# Patient Record
Sex: Male | Born: 1950
Health system: Southern US, Community
[De-identification: ages and names within clinical notes are randomized; demographics above are authoritative.]

## PROBLEM LIST (undated history)

## (undated) DIAGNOSIS — E119 Type 2 diabetes mellitus without complications: Secondary | ICD-10-CM

---

## 1999-10-14 ENCOUNTER — Emergency Department (HOSPITAL_COMMUNITY): Admission: EM | Admit: 1999-10-14 | Discharge: 1999-10-14 | Payer: Self-pay | Admitting: Emergency Medicine

## 2002-07-27 ENCOUNTER — Emergency Department (HOSPITAL_COMMUNITY): Admission: EM | Admit: 2002-07-27 | Discharge: 2002-07-27 | Payer: Self-pay | Admitting: Emergency Medicine

## 2003-05-31 ENCOUNTER — Ambulatory Visit (HOSPITAL_COMMUNITY): Admission: RE | Admit: 2003-05-31 | Discharge: 2003-05-31 | Payer: Self-pay | Admitting: Internal Medicine

## 2003-07-12 ENCOUNTER — Emergency Department (HOSPITAL_COMMUNITY): Admission: EM | Admit: 2003-07-12 | Discharge: 2003-07-13 | Payer: Self-pay | Admitting: Emergency Medicine

## 2003-07-13 ENCOUNTER — Emergency Department (HOSPITAL_COMMUNITY): Admission: EM | Admit: 2003-07-13 | Discharge: 2003-07-13 | Payer: Self-pay | Admitting: Emergency Medicine

## 2004-01-01 ENCOUNTER — Ambulatory Visit: Payer: Self-pay | Admitting: Internal Medicine

## 2004-03-19 ENCOUNTER — Ambulatory Visit: Payer: Self-pay | Admitting: Internal Medicine

## 2004-04-18 ENCOUNTER — Ambulatory Visit: Payer: Self-pay | Admitting: Internal Medicine

## 2004-04-22 ENCOUNTER — Ambulatory Visit: Payer: Self-pay | Admitting: Internal Medicine

## 2004-06-02 ENCOUNTER — Ambulatory Visit: Payer: Self-pay | Admitting: Internal Medicine

## 2004-06-04 ENCOUNTER — Ambulatory Visit: Payer: Self-pay | Admitting: Internal Medicine

## 2004-06-17 ENCOUNTER — Ambulatory Visit: Payer: Self-pay | Admitting: Internal Medicine

## 2004-07-15 ENCOUNTER — Ambulatory Visit: Payer: Self-pay | Admitting: Internal Medicine

## 2004-07-16 ENCOUNTER — Ambulatory Visit: Payer: Self-pay | Admitting: *Deleted

## 2005-09-30 ENCOUNTER — Ambulatory Visit: Payer: Self-pay | Admitting: Family Medicine

## 2005-10-01 ENCOUNTER — Encounter (INDEPENDENT_AMBULATORY_CARE_PROVIDER_SITE_OTHER): Payer: Self-pay | Admitting: Internal Medicine

## 2005-11-23 ENCOUNTER — Ambulatory Visit: Payer: Self-pay | Admitting: Internal Medicine

## 2005-11-26 ENCOUNTER — Ambulatory Visit: Payer: Self-pay | Admitting: Internal Medicine

## 2006-02-11 ENCOUNTER — Ambulatory Visit: Payer: Self-pay | Admitting: Internal Medicine

## 2006-03-23 ENCOUNTER — Ambulatory Visit: Payer: Self-pay | Admitting: Internal Medicine

## 2006-05-07 ENCOUNTER — Ambulatory Visit: Payer: Self-pay | Admitting: Internal Medicine

## 2006-10-20 ENCOUNTER — Emergency Department (HOSPITAL_COMMUNITY): Admission: EM | Admit: 2006-10-20 | Discharge: 2006-10-20 | Payer: Self-pay

## 2006-11-15 ENCOUNTER — Ambulatory Visit: Payer: Self-pay | Admitting: Family Medicine

## 2006-11-23 ENCOUNTER — Ambulatory Visit: Payer: Self-pay | Admitting: Internal Medicine

## 2006-11-23 DIAGNOSIS — E785 Hyperlipidemia, unspecified: Secondary | ICD-10-CM

## 2006-11-25 ENCOUNTER — Encounter: Admission: RE | Admit: 2006-11-25 | Discharge: 2006-12-08 | Payer: Self-pay | Admitting: Family Medicine

## 2006-12-29 ENCOUNTER — Ambulatory Visit: Payer: Self-pay | Admitting: Internal Medicine

## 2006-12-29 ENCOUNTER — Encounter (INDEPENDENT_AMBULATORY_CARE_PROVIDER_SITE_OTHER): Payer: Self-pay | Admitting: *Deleted

## 2006-12-29 LAB — CONVERTED CEMR LAB
Alkaline Phosphatase: 77 units/L (ref 39–117)
BUN: 11 mg/dL (ref 6–23)
Creatinine, Ser: 0.93 mg/dL (ref 0.40–1.50)
Glucose, Bld: 76 mg/dL (ref 70–99)
HDL: 38 mg/dL — ABNORMAL LOW (ref >39)
LDL Cholesterol: 103 mg/dL — ABNORMAL HIGH (ref 0–99)
Total Bilirubin: 0.4 mg/dL (ref 0.3–1.2)
Triglycerides: 155 mg/dL — ABNORMAL HIGH (ref <150)
VLDL: 31 mg/dL (ref 0–40)

## 2007-01-13 ENCOUNTER — Ambulatory Visit: Payer: Self-pay | Admitting: Internal Medicine

## 2007-04-21 ENCOUNTER — Ambulatory Visit: Payer: Self-pay | Admitting: Internal Medicine

## 2007-04-21 LAB — CONVERTED CEMR LAB
ALT: 26 units/L (ref 0–53)
AST: 26 units/L (ref 0–37)
Cholesterol: 183 mg/dL (ref 0–200)
Creatinine, Ser: 0.94 mg/dL (ref 0.40–1.50)
HDL: 45 mg/dL (ref >39)
PSA: 1.05 ng/mL (ref 0.10–4.00)
Total Bilirubin: 0.7 mg/dL (ref 0.3–1.2)
Total CHOL/HDL Ratio: 4.1
VLDL: 21 mg/dL (ref 0–40)

## 2007-06-15 ENCOUNTER — Ambulatory Visit: Payer: Self-pay | Admitting: Internal Medicine

## 2008-03-22 ENCOUNTER — Ambulatory Visit: Payer: Self-pay | Admitting: Internal Medicine

## 2008-05-22 ENCOUNTER — Ambulatory Visit: Payer: Self-pay | Admitting: Internal Medicine

## 2008-05-22 LAB — CONVERTED CEMR LAB
Calcium: 10.1 mg/dL (ref 8.4–10.5)
Cholesterol: 168 mg/dL (ref 0–200)
Creatinine, Ser: 0.79 mg/dL (ref 0.40–1.50)
Glucose, Bld: 108 mg/dL — ABNORMAL HIGH (ref 70–99)
Sodium: 143 meq/L (ref 135–145)
Total CHOL/HDL Ratio: 3.6

## 2008-06-05 ENCOUNTER — Ambulatory Visit: Payer: Self-pay | Admitting: Internal Medicine

## 2008-06-26 ENCOUNTER — Ambulatory Visit: Payer: Self-pay | Admitting: *Deleted

## 2008-09-04 ENCOUNTER — Ambulatory Visit: Payer: Self-pay | Admitting: Internal Medicine

## 2009-05-30 ENCOUNTER — Ambulatory Visit: Payer: Self-pay | Admitting: Family Medicine

## 2010-02-18 ENCOUNTER — Encounter (INDEPENDENT_AMBULATORY_CARE_PROVIDER_SITE_OTHER): Payer: Self-pay | Admitting: Internal Medicine

## 2010-02-18 LAB — CONVERTED CEMR LAB
AST: 25 units/L (ref 0–37)
Alkaline Phosphatase: 55 units/L (ref 39–117)
BUN: 10 mg/dL (ref 6–23)
Basophils Relative: 1 % (ref 0–1)
Creatinine, Ser: 0.89 mg/dL (ref 0.40–1.50)
Eosinophils Absolute: 0.1 10*3/uL (ref 0.0–0.7)
Eosinophils Relative: 2 % (ref 0–5)
Glucose, Bld: 98 mg/dL (ref 70–99)
HCT: 44.4 % (ref 39.0–52.0)
HDL: 43 mg/dL (ref >39)
Hemoglobin: 14.6 g/dL (ref 13.0–17.0)
Hgb A1c MFr Bld: 6.4 % — ABNORMAL HIGH (ref <5.7)
LDL Cholesterol: 106 mg/dL — ABNORMAL HIGH (ref 0–99)
Lymphs Abs: 2.7 10*3/uL (ref 0.7–4.0)
MCHC: 32.9 g/dL (ref 30.0–36.0)
MCV: 93.7 fL (ref 78.0–100.0)
Monocytes Absolute: 0.5 10*3/uL (ref 0.1–1.0)
Monocytes Relative: 8 % (ref 3–12)
Neutrophils Relative %: 48 % (ref 43–77)
RBC: 4.74 M/uL (ref 4.22–5.81)
Total Bilirubin: 0.7 mg/dL (ref 0.3–1.2)
Total CHOL/HDL Ratio: 4
Triglycerides: 109 mg/dL (ref <150)

## 2010-08-01 ENCOUNTER — Emergency Department (HOSPITAL_COMMUNITY)
Admission: EM | Admit: 2010-08-01 | Discharge: 2010-08-01 | Disposition: A | Discharge status: Home / Self Care | Payer: No Typology Code available for payment source | Attending: Emergency Medicine | Admitting: Emergency Medicine

## 2010-08-01 DIAGNOSIS — R259 Unspecified abnormal involuntary movements: Secondary | ICD-10-CM | POA: Insufficient documentation

## 2010-08-01 DIAGNOSIS — Z79899 Other long term (current) drug therapy: Secondary | ICD-10-CM | POA: Insufficient documentation

## 2010-08-01 DIAGNOSIS — M25519 Pain in unspecified shoulder: Secondary | ICD-10-CM | POA: Insufficient documentation

## 2010-08-01 DIAGNOSIS — Z7982 Long term (current) use of aspirin: Secondary | ICD-10-CM | POA: Insufficient documentation

## 2010-08-01 DIAGNOSIS — E7800 Pure hypercholesterolemia, unspecified: Secondary | ICD-10-CM | POA: Insufficient documentation

## 2010-08-01 DIAGNOSIS — M62838 Other muscle spasm: Secondary | ICD-10-CM | POA: Insufficient documentation

## 2010-08-18 ENCOUNTER — Emergency Department (HOSPITAL_COMMUNITY)
Admission: EM | Admit: 2010-08-18 | Discharge: 2010-08-19 | Disposition: A | Discharge status: Home / Self Care | Payer: No Typology Code available for payment source | Attending: Emergency Medicine | Admitting: Emergency Medicine

## 2010-08-18 ENCOUNTER — Emergency Department (HOSPITAL_COMMUNITY): Payer: No Typology Code available for payment source

## 2010-08-18 DIAGNOSIS — S6990XA Unspecified injury of unspecified wrist, hand and finger(s), initial encounter: Secondary | ICD-10-CM | POA: Insufficient documentation

## 2010-08-18 DIAGNOSIS — R29898 Other symptoms and signs involving the musculoskeletal system: Secondary | ICD-10-CM | POA: Insufficient documentation

## 2010-08-18 DIAGNOSIS — E7800 Pure hypercholesterolemia, unspecified: Secondary | ICD-10-CM | POA: Insufficient documentation

## 2010-08-18 DIAGNOSIS — Z7982 Long term (current) use of aspirin: Secondary | ICD-10-CM | POA: Insufficient documentation

## 2010-08-18 DIAGNOSIS — M5412 Radiculopathy, cervical region: Secondary | ICD-10-CM | POA: Insufficient documentation

## 2010-08-18 DIAGNOSIS — Z79899 Other long term (current) drug therapy: Secondary | ICD-10-CM | POA: Insufficient documentation

## 2010-08-18 DIAGNOSIS — Z09 Encounter for follow-up examination after completed treatment for conditions other than malignant neoplasm: Secondary | ICD-10-CM | POA: Insufficient documentation

## 2010-08-18 DIAGNOSIS — S59909A Unspecified injury of unspecified elbow, initial encounter: Secondary | ICD-10-CM | POA: Insufficient documentation

## 2010-09-17 ENCOUNTER — Emergency Department (HOSPITAL_COMMUNITY)
Admission: EM | Admit: 2010-09-17 | Discharge: 2010-09-17 | Disposition: A | Discharge status: Home / Self Care | Payer: No Typology Code available for payment source | Attending: Emergency Medicine | Admitting: Emergency Medicine

## 2010-09-17 DIAGNOSIS — M549 Dorsalgia, unspecified: Secondary | ICD-10-CM | POA: Insufficient documentation

## 2010-09-17 DIAGNOSIS — T148XXA Other injury of unspecified body region, initial encounter: Secondary | ICD-10-CM | POA: Insufficient documentation

## 2010-09-17 DIAGNOSIS — M79609 Pain in unspecified limb: Secondary | ICD-10-CM | POA: Insufficient documentation

## 2011-01-27 LAB — SAMPLE TO BLOOD BANK

## 2011-01-27 LAB — CBC
Hemoglobin: 13.9
MCHC: 34.5
MCV: 89.8
RBC: 4.49
WBC: 7.9

## 2011-01-27 LAB — I-STAT 8, (EC8 V) (CONVERTED LAB)
BUN: 13
Bicarbonate: 24.8 — ABNORMAL HIGH
Chloride: 104
Glucose, Bld: 113 — ABNORMAL HIGH
HCT: 44
Hemoglobin: 15
Operator id: 270651
pCO2, Ven: 37.6 — ABNORMAL LOW

## 2011-01-27 LAB — PROTIME-INR
INR: 1
Prothrombin Time: 13.3

## 2012-08-29 ENCOUNTER — Ambulatory Visit: Payer: No Typology Code available for payment source | Attending: Internal Medicine | Admitting: Internal Medicine

## 2012-08-29 VITALS — BP 135/77 | HR 69 | Temp 96.6°F | Resp 17 | Wt 190.0 lb

## 2012-08-29 DIAGNOSIS — H538 Other visual disturbances: Secondary | ICD-10-CM

## 2012-08-29 DIAGNOSIS — E785 Hyperlipidemia, unspecified: Secondary | ICD-10-CM

## 2012-08-29 NOTE — Progress Notes (Signed)
Patient ID: Connor Murray, male   DOB: November 23, 1950, 62 y.o.   MRN: [id number] Patient Demographics  Connor Murray, is a 62 y.o. male  [id number]  [id number]  DOB - 03-Mar-1951  Chief Complaint  Patient presents with  . Blurred Vision        Subjective:   [name] today is here to establish primary care.  His only complaint at this time is b/l blurry vision-he thinks he needs new glasses and is requesting a opthalomology referral. Patient has No headache, No chest pain, No abdominal pain - No Nausea, No new weakness tingling or numbness, No Cough - SOB.   Objective:    Filed Vitals:   08/29/12 1622  BP: 135/77  Pulse: 69  Temp: 96.6 F (35.9 C)  Resp: 17  Weight: 190 lb (86.183 kg)  SpO2: 97%     ALLERGIES:  No Known Allergies  PAST MEDICAL HISTORY: Dyslipidemia   PAST SURGICAL HISTORY: None  FAMILY HISTORY: No family hx of CAD  MEDICATIONS AT HOME: Prior to Admission medications   Medication Sig Start Date End Date Taking? Authorizing Provider  rosuvastatin (CRESTOR) 20 MG tablet Take 20 mg by mouth daily.   Yes Historical Provider, MD    REVIEW OF SYSTEMS:  Constitutional:   No   Fevers, chills, fatigue.  HEENT:    No headaches, Sore throat,   Cardio-vascular: No chest pain,  Orthopnea, swelling in lower extremities, anasarca, palpitations  GI:  No abdominal pain, nausea, vomiting, diarrhea  Resp: No shortness of breath,  No coughing up of blood.No cough.No wheezing.  Skin:  no rash or lesions.  GU:  no dysuria, change in color of urine, no urgency or frequency.  No flank pain.  Musculoskeletal: No joint pain or swelling.  No decreased range of motion.  No back pain.  Psych: No change in mood or affect. No depression or anxiety.  No memory loss.   Exam  General appearance :Awake, alert, not in any distress. Speech Clear. Not toxic Looking HEENT: Atraumatic and Normocephalic, pupils equally reactive to light and  accomodation Neck: supple, no JVD. No cervical lymphadenopathy.  Chest:Good air entry bilaterally, no added sounds  CVS: S1 S2 regular, no murmurs.  Abdomen: Bowel sounds present, Non tender and not distended with no gaurding, rigidity or rebound. Extremities: B/L Lower Ext shows no edema, both legs are warm to touch Neurology: Awake alert, and oriented X 3, CN II-XII intact, Non focal Skin:No Rash Wounds:N/A    Data Review   CBC No results found for this basename: WBC, HGB, HCT, PLT, MCV, MCH, MCHC, RDW, NEUTRABS, LYMPHSABS, MONOABS, EOSABS, BASOSABS, BANDABS, BANDSABD,  in the last 168 hours  Chemistries   No results found for this basename: NA, K, CL, CO2, GLUCOSE, BUN, CREATININE, GFRCGP, CALCIUM, MG, AST, ALT, ALKPHOS, BILITOT,  in the last 168 hours ------------------------------------------------------------------------------------------------------------------ No results found for this basename: HGBA1C,  in the last 72 hours ------------------------------------------------------------------------------------------------------------------ No results found for this basename: CHOL, HDL, LDLCALC, TRIG, CHOLHDL, LDLDIRECT,  in the last 72 hours ------------------------------------------------------------------------------------------------------------------ No results found for this basename: TSH, T4TOTAL, FREET3, T3FREE, THYROIDAB,  in the last 72 hours ------------------------------------------------------------------------------------------------------------------ No results found for this basename: VITAMINB12, FOLATE, FERRITIN, TIBC, IRON, RETICCTPCT,  in the last 72 hours  Coagulation profile  No results found for this basename: INR, PROTIME,  in the last 168 hours    Assessment & Plan   Dyslipidemia -c/w Statin -check a Lipid panel prior to next visit  Blurry Vision -wears  glasses-needs referral to either a opthalmologist or a optometrist  Return to clinic in 1  month-please follow up on labs

## 2012-08-29 NOTE — Progress Notes (Signed)
Patient complains of having trouble  With eyes past few months

## 2012-09-08 ENCOUNTER — Ambulatory Visit: Payer: No Typology Code available for payment source | Attending: Family Medicine

## 2012-09-08 DIAGNOSIS — E785 Hyperlipidemia, unspecified: Secondary | ICD-10-CM

## 2012-09-08 DIAGNOSIS — H538 Other visual disturbances: Secondary | ICD-10-CM

## 2012-09-08 LAB — CBC
Platelets: 229 10*3/uL (ref 150–400)
RBC: 4.92 MIL/uL (ref 4.22–5.81)
RDW: 13.8 % (ref 11.5–15.5)
WBC: 7.4 10*3/uL (ref 4.0–10.5)

## 2012-09-08 LAB — LIPID PANEL
LDL Cholesterol: 129 mg/dL — ABNORMAL HIGH (ref 0–99)
Triglycerides: 188 mg/dL — ABNORMAL HIGH (ref <150)
VLDL: 38 mg/dL (ref 0–40)

## 2012-09-08 LAB — COMPREHENSIVE METABOLIC PANEL
ALT: 39 U/L (ref 0–53)
AST: 28 U/L (ref 0–37)
Albumin: 4.1 g/dL (ref 3.5–5.2)
CO2: 24 mEq/L (ref 19–32)
Calcium: 9.9 mg/dL (ref 8.4–10.5)
Chloride: 106 mEq/L (ref 96–112)
Potassium: 4.3 mEq/L (ref 3.5–5.3)
Sodium: 142 mEq/L (ref 135–145)
Total Protein: 7.7 g/dL (ref 6.0–8.3)

## 2012-09-08 LAB — TSH: TSH: 0.779 u[IU]/mL (ref 0.350–4.500)

## 2012-09-08 NOTE — Progress Notes (Unsigned)
Patient here for labwork Cbc cmp Lipid tsh Hgb a1c

## 2012-09-09 LAB — HEMOGLOBIN A1C: Hgb A1c MFr Bld: 6.5 % — ABNORMAL HIGH (ref <5.7)

## 2012-09-12 ENCOUNTER — Telehealth: Payer: Self-pay | Admitting: *Deleted

## 2012-09-12 ENCOUNTER — Ambulatory Visit: Payer: No Typology Code available for payment source

## 2012-09-12 NOTE — Telephone Encounter (Signed)
09/12/12 Please review patient lab 09/08/12 and make recommendations. Thanks Patient waiting in lobby.  P.Sumedh Shinsato,RN BSN [facility]

## 2012-09-14 ENCOUNTER — Ambulatory Visit: Payer: No Typology Code available for payment source | Attending: Family Medicine | Admitting: Family Medicine

## 2012-09-14 ENCOUNTER — Encounter: Payer: Self-pay | Admitting: Family Medicine

## 2012-09-14 VITALS — BP 132/75 | HR 72 | Temp 98.8°F | Resp 16 | Wt 189.2 lb

## 2012-09-14 DIAGNOSIS — E119 Type 2 diabetes mellitus without complications: Secondary | ICD-10-CM | POA: Insufficient documentation

## 2012-09-14 DIAGNOSIS — E785 Hyperlipidemia, unspecified: Secondary | ICD-10-CM

## 2012-09-14 DIAGNOSIS — F528 Other sexual dysfunction not due to a substance or known physiological condition: Secondary | ICD-10-CM

## 2012-09-14 NOTE — Patient Instructions (Signed)
A1c, Hemoglobin A1c The A1c (hemoglobin A1c, glycosylated hemoglobin) test checks the average amount of sugar (glucose) in the blood over the last 2 to 3 months. It does this by measuring the concentration of glycosylated hemoglobin. As glucose circulates in the blood, some of it binds to hemoglobin A. This is the main form of hemoglobin in adults. Hemoglobin is a red protein that carries oxygen in the red blood cells (RBCs). Once the glucose is bound to the hemoglobin A, it remains there for the life of the red blood cell (about 120 days). This combination of glucose and hemoglobin A is called A1c. Increased glucose in the blood, increases the hemoglobin A1c. A1c levels do not change quickly but will shift as RBCs are replaced. A1c is a valuable test because it enables you to know how your glucose has been controlled over the past 3 months.  5% A1c  Estimated Average Glucose mg/dL: 97  6% Z6X  Estimated Average Glucose mg/dL: 096  7% E4V  Estimated Average Glucose mg/dL: 409  8% W1X  Estimated Average Glucose mg/dL: 914  9% N8G  Estimated Average Glucose mg/dL: 956  21% H0Q  Estimated Average Glucose mg/dL: 657  84% O9G  Estimated Average Glucose mg/dL: 295  28% U1L  Estimated Average Glucose mg/dL: 244 The American Diabetes Association (ADA) recommends testing your A1c level 4 times each year if you have type 1 or type 2 diabetes and use insulin; or 2 times each year if you have type 2 diabetes and do not use insulin. When someone is first diagnosed with diabetes or if control is not good, A1c may be ordered more frequently. PREPARATION FOR TEST No preparation or fasting is necessary for this blood sample. NORMAL FINDINGS   Adults without diabetes: 2.2 to 4.8%  Children without diabetes: 1.8 to 4.0%  Good diabetic control: 2.5 to 5.9%  Fair diabetic control: 6 to 8%  Poor diabetic control: greater than 8% The values of A1c may be falsely low in pregnancy, in  disorders with shortened red blood cell lives, or in sickle cell disease or trait (carrier). The values may be falsely high in disorders with longer red cell lives, or in people with Thallassemia, kidney failure, or iron deficiency anemia. Ranges for normal findings may vary among different laboratories and hospitals. You should always check with your doctor after having lab work or other tests done to discuss the meaning of your test results and whether your values are considered within normal limits. MEANING OF TEST  Your caregiver will go over the test results with you and discuss the importance and meaning of your results, as well as treatment options and the need for additional tests if necessary. If your A1c is greater than 7%, discuss treatment options with your caregiver. OBTAINING THE TEST RESULTS  It is your responsibility to obtain your test results. Ask the lab or department performing the test when and how you will get your results. Document Released: 04/21/2004 Document Revised: 06/22/2011 Document Reviewed: 02/25/2012 [employer] Patient Information 2014 [place], [place]. 1800 Calorie Diet for Diabetes Meal Planning The 1800 calorie diet is designed for eating up to 1800 calories each day. Following this diet and making healthy meal choices can help improve overall health. This diet controls blood sugar (glucose) levels and can also help lower blood pressure and cholesterol. SERVING SIZES Measuring foods and serving sizes helps to make sure you are getting the right amount of food. The list below tells how big or small some  common serving sizes are:  1 oz.........4 stacked dice.  3 oz........Marland KitchenDeck of cards.  1 tsp.......Marland KitchenTip of little finger.  1 tbs......Marland KitchenMarland KitchenThumb.  2 tbs.......Marland KitchenGolf ball.   cup......Marland KitchenHalf of a fist.  1 cup.......Marland KitchenA fist. GUIDELINES FOR CHOOSING FOODS The goal of this diet is to eat a variety of foods and limit calories to 1800 each day. This can be done by  choosing foods that are low in calories and fat. The diet also suggests eating small amounts of food frequently. Doing this helps control your blood glucose levels so they do not get too high or too low. Each meal or snack may include a protein food source to help you feel more satisfied and to stabilize your blood glucose. Try to eat about the same amount of food around the same time each day. This includes weekend days, travel days, and days off work. Space your meals about 4 to 5 hours apart and add a snack between them if you wish.  For example, a daily food plan could include breakfast, a morning snack, lunch, dinner, and an evening snack. Healthy meals and snacks include whole grains, vegetables, fruits, lean meats, poultry, fish, and dairy products. As you plan your meals, select a variety of foods. Choose from the bread and starch, vegetable, fruit, dairy, and meat/protein groups. Examples of foods from each group and their suggested serving sizes are listed below. Use measuring cups and spoons to become familiar with what a healthy portion looks like. Bread and Starch Each serving equals 15 grams of carbohydrates.  1 slice bread.   bagel.   cup cold cereal (unsweetened).   cup hot cereal or mashed potatoes.  1 small potato (size of a computer mouse).   cup cooked pasta or rice.   English muffin.  1 cup broth-based soup.  3 cups of popcorn.  4 to 6 whole-wheat crackers.   cup cooked beans, peas, or corn. Vegetable Each serving equals 5 grams of carbohydrates.   cup cooked vegetables.  1 cup raw vegetables.   cup tomato or vegetable juice. Fruit Each serving equals 15 grams of carbohydrates.  1 small apple or orange.  1 cup watermelon or strawberries.   cup applesauce (no sugar added).  2 tbs raisins.   banana.   cup canned fruit, packed in water, its own juice, or sweetened with a sugar substitute.   cup unsweetened fruit juice. Dairy Each  serving equals 12 to 15 grams of carbohydrates.  1 cup fat-free milk.  6 oz artificially sweetened yogurt or plain yogurt.  1 cup low-fat buttermilk.  1 cup soy milk.  1 cup almond milk. Meat/Protein  1 large egg.  2 to 3 oz meat, poultry, or fish.   cup low-fat cottage cheese.  1 tbs peanut butter.  1 oz low-fat cheese.   cup tuna in water.   cup tofu. Fat  1 tsp oil.  1 tsp trans-fat-free margarine.  1 tsp butter.  1 tsp mayonnaise.  2 tbs avocado.  1 tbs salad dressing.  1 tbs cream cheese.  2 tbs sour cream. SAMPLE 1800 CALORIE DIET PLAN Breakfast   cup unsweetened cereal (1 carb serving).  1 cup fat-free milk (1 carb serving).  1 slice whole-wheat toast (1 carb serving).   small banana (1 carb serving).  1 scrambled egg.  1 tsp trans-fat-free margarine. Lunch  Tuna sandwich.  2 slices whole-wheat bread (2 carb servings).   cup canned tuna in water, drained.  1 tbs reduced fat mayonnaise.  1 stalk celery, chopped.  2 slices tomato.  1 lettuce leaf.  1 cup carrot sticks.  24 to 30 seedless grapes (2 carb servings).  6 oz light yogurt (1 carb serving). Afternoon Snack  3 graham cracker squares (1 carb serving).  Fat-free milk, 1 cup (1 carb serving).  1 tbs peanut butter. Dinner  3 oz salmon, broiled with 1 tsp oil.  1 cup mashed potatoes (2 carb servings) with 1 tsp trans-fat-free margarine.  1 cup fresh or frozen green beans.  1 cup steamed asparagus.  1 cup fat-free milk (1 carb serving). Evening Snack  3 cups air-popped popcorn (1 carb serving).  2 tbs parmesan cheese sprinkled on top. MEAL PLAN Use this worksheet to help you make a daily meal plan based on the 1800 calorie diet suggestions. If you are using this plan to help you control your blood glucose, you may interchange carbohydrate-containing foods (dairy, starches, and fruits). Select a variety of fresh foods of varying colors and flavors. The  total amount of carbohydrate in your meals or snacks is more important than making sure you include all of the food groups every time you eat. Choose from the following foods to build your day's meals:  8 Starches.  4 Vegetables.  3 Fruits.  2 Dairy.  6 to 7 oz Meat/Protein.  Up to 4 Fats. Your dietician can use this worksheet to help you decide how many servings and which types of foods are right for you. BREAKFAST Food Group and Servings / Food Choice Starch ________________________________________________________ Dairy _________________________________________________________ Fruit _________________________________________________________ Meat/Protein __________________________________________________ Fat ___________________________________________________________ LUNCH Food Group and Servings / Food Choice Starch ________________________________________________________ Meat/Protein __________________________________________________ Vegetable _____________________________________________________ Fruit _________________________________________________________ Dairy _________________________________________________________ Fat ___________________________________________________________ Aura Fey Food Group and Servings / Food Choice Starch ________________________________________________________ Meat/Protein __________________________________________________ Fruit __________________________________________________________ Dairy _________________________________________________________ Laural Golden Food Group and Servings / Food Choice Starch _________________________________________________________ Meat/Protein ___________________________________________________ Dairy __________________________________________________________ Vegetable ______________________________________________________ Fruit ___________________________________________________________ Fat  ____________________________________________________________ Lollie Sails Food Group and Servings / Food Choice Fruit __________________________________________________________ Meat/Protein ___________________________________________________ Dairy __________________________________________________________ Starch _________________________________________________________ DAILY TOTALS Starch ____________________________ Vegetable _________________________ Fruit _____________________________ Dairy _____________________________ Meat/Protein______________________ Fat _______________________________ Document Released: 10/20/2004 Document Revised: 06/22/2011 Document Reviewed: 02/13/2011 [employer] Patient Information 2014 [employer]. Type 2 Diabetes Mellitus, Adult Type 2 diabetes mellitus, often simply referred to as type 2 diabetes, is a long-lasting (chronic) disease. In type 2 diabetes, the pancreas does not make enough insulin (a hormone), the cells are less responsive to the insulin that is made (insulin resistance), or both. Normally, insulin moves sugars from food into the tissue cells. The tissue cells use the sugars for energy. The lack of insulin or the lack of normal response to insulin causes excess sugars to build up in the blood instead of going into the tissue cells. As a result, high blood sugar (hyperglycemia) develops. The effect of high sugar (glucose) levels can cause many complications. Type 2 diabetes was also previously called adult-onset diabetes but it can occur at any age.  RISK FACTORS  A person is predisposed to developing type 2 diabetes if someone in the family has the disease and also has one or more of the following primary risk factors:  Overweight.  An inactive lifestyle.  A history of consistently eating high-calorie foods. Maintaining a normal weight and regular physical activity can reduce the chance of developing type 2 diabetes. SYMPTOMS  A person  with type 2 diabetes may not show symptoms initially. The symptoms of type 2 diabetes appear slowly. The symptoms include:  Increased thirst (polydipsia).  Increased urination (polyuria).  Increased urination during the night (nocturia).  Weight loss. This weight loss may be rapid.  Frequent, recurring infections.  Tiredness (fatigue).  Weakness.  Vision changes, such as blurred vision.  Fruity smell to your breath.  Abdominal pain.  Nausea or vomiting.  Cuts or bruises which are slow to heal.  Tingling or numbness in the hands or feet. DIAGNOSIS Type 2 diabetes is frequently not diagnosed until complications of diabetes are present. Type 2 diabetes is diagnosed when symptoms or complications are present and when blood glucose levels are increased. Your blood glucose level may be checked by one or more of the following blood tests:  A fasting blood glucose test. You will not be allowed to eat for at least 8 hours before a blood sample is taken.  A random blood glucose test. Your blood glucose is checked at any time of the day regardless of when you ate.  A hemoglobin A1c blood glucose test. A hemoglobin A1c test provides information about blood glucose control over the previous 3 months.  An oral glucose tolerance test (OGTT). Your blood glucose is measured after you have not eaten (fasted) for 2 hours and then after you drink a glucose-containing beverage. TREATMENT   You may need to take insulin or diabetes medicine daily to keep blood glucose levels in the desired range.  You will need to match insulin dosing with exercise and healthy food choices. The treatment goal is to maintain the before meal blood sugar (preprandial glucose) level at 70 130 mg/dL. HOME CARE INSTRUCTIONS   Have your hemoglobin A1c level checked twice a year.  Perform daily blood glucose monitoring as directed by your caregiver.  Monitor urine ketones when you are ill and as directed by your  caregiver.  Take your diabetes medicine or insulin as directed by your caregiver to maintain your blood glucose levels in the desired range.  Never run out of diabetes medicine or insulin. It is needed every day.  Adjust insulin based on your intake of carbohydrates. Carbohydrates can raise blood glucose levels but need to be included in your diet. Carbohydrates provide vitamins, minerals, and fiber which are an essential part of a healthy diet. Carbohydrates are found in fruits, vegetables, whole grains, dairy products, legumes, and foods containing added sugars.    Eat healthy foods. Alternate 3 meals with 3 snacks.  Lose weight if overweight.  Carry a medical alert card or wear your medical alert jewelry.  Carry a 15 gram carbohydrate snack with you at all times to treat low blood glucose (hypoglycemia). Some examples of 15 gram carbohydrate snacks include:  Glucose tablets, 3 or 4   Glucose gel, 15 gram tube  Raisins, 2 tablespoons (24 grams)  Jelly beans, 6  Animal crackers, 8  Regular pop, 4 ounces (120 mL)  Gummy treats, 9  Recognize hypoglycemia. Hypoglycemia occurs with blood glucose levels of 70 mg/dL and below. The risk for hypoglycemia increases when fasting or skipping meals, during or after intense exercise, and during sleep. Hypoglycemia symptoms can include:  Tremors or shakes.  Decreased ability to concentrate.  Sweating.  Increased heart rate.  Headache.  Dry mouth.  Hunger.  Irritability.  Anxiety.  Restless sleep.  Altered speech or coordination.  Confusion.  Treat hypoglycemia promptly. If you are alert and able to safely swallow, follow the 15:15 rule:  Take 15 20 grams of rapid-acting glucose or carbohydrate. Rapid-acting options include glucose gel, glucose tablets, or 4 ounces (120 mL) of  fruit juice, regular soda, or low fat milk.  Check your blood glucose level 15 minutes after taking the glucose.  Take 15 20 grams more of  glucose if the repeat blood glucose level is still 70 mg/dL or below.  Eat a meal or snack within 1 hour once blood glucose levels return to normal.    Be alert to polyuria and polydipsia which are early signs of hyperglycemia. An early awareness of hyperglycemia allows for prompt treatment. Treat hyperglycemia as directed by your caregiver.  Engage in at least 150 minutes of moderate-intensity physical activity a week, spread over at least 3 days of the week or as directed by your caregiver. In addition, you should engage in resistance exercise at least 2 times a week or as directed by your caregiver.  Adjust your medicine and food intake as needed if you start a new exercise or sport.  Follow your sick day plan at any time you are unable to eat or drink as usual.  Avoid tobacco use.  Limit alcohol intake to no more than 1 drink per day for nonpregnant women and 2 drinks per day for men. You should drink alcohol only when you are also eating food. Talk with your caregiver whether alcohol is safe for you. Tell your caregiver if you drink alcohol several times a week.  Follow up with your caregiver regularly.  Schedule an eye exam soon after the diagnosis of type 2 diabetes and then annually.  Perform daily skin and foot care. Examine your skin and feet daily for cuts, bruises, redness, nail problems, bleeding, blisters, or sores. A foot exam by a caregiver should be done annually.  Brush your teeth and gums at least twice a day and floss at least once a day. Follow up with your dentist regularly.  Share your diabetes management plan with your workplace or school.  Stay up-to-date with immunizations.  Learn to manage stress.  Obtain ongoing diabetes education and support as needed.  Participate in, or seek rehabilitation as needed to maintain or improve independence and quality of life. Request a physical or occupational therapy referral if you are having foot or hand numbness or  difficulties with grooming, dressing, eating, or physical activity. SEEK MEDICAL CARE IF:   You are unable to eat food or drink fluids for more than 6 hours.  You have nausea and vomiting for more than 6 hours.  Your blood glucose level is over 240 mg/dL.  There is a change in mental status.  You develop an additional serious illness.  You have diarrhea for more than 6 hours.  You have been sick or have had a fever for a couple of days and are not getting better.  You have pain during any physical activity.  SEEK IMMEDIATE MEDICAL CARE IF:  You have difficulty breathing.  You have moderate to large ketone levels. MAKE SURE YOU:  Understand these instructions.  Will watch your condition.  Will get help right away if you are not doing well or get worse. Document Released: 03/30/2005 Document Revised: 12/23/2011 Document Reviewed: 10/27/2011 [facility] Patient Information 2014 [place], [place].

## 2012-09-14 NOTE — Progress Notes (Signed)
Patient here for follow up Review labs and medications

## 2012-09-14 NOTE — Progress Notes (Signed)
Patient ID: Connor Murray, male   DOB: [birth year], 62 y.o.   MRN: [id number]  CC: follow up   HPI: Pt presenting to follow up on lab results from recent blood draw.   Pt says that he feels well today, no complaints.    No Known Allergies No past medical history on file. Current Outpatient Prescriptions on File Prior to Visit  Medication Sig Dispense Refill  . rosuvastatin (CRESTOR) 20 MG tablet Take 20 mg by mouth daily.       No current facility-administered medications on file prior to visit.   No family history on file. History   Social History  . Marital Status: Divorced    Spouse Name: N/A    Number of Children: N/A  . Years of Education: N/A   Occupational History  . Not on file.   Social History Main Topics  . Smoking status: Not on file  . Smokeless tobacco: Not on file  . Alcohol Use: Not on file  . Drug Use: Not on file  . Sexually Active: Not on file   Other Topics Concern  . Not on file   Social History Narrative  . No narrative on file    Review of Systems  Constitutional: Negative for fever, chills, diaphoresis, activity change, appetite change and fatigue.  HENT: Negative for ear pain, nosebleeds, congestion, facial swelling, rhinorrhea, neck pain, neck stiffness and ear discharge.   Eyes: Negative for pain, discharge, redness, itching and visual disturbance.  Respiratory: Negative for cough, choking, chest tightness, shortness of breath, wheezing and stridor.   Cardiovascular: Negative for chest pain, palpitations and leg swelling.  Gastrointestinal: Negative for abdominal distention.  Genitourinary: Negative for dysuria, urgency, frequency, hematuria, flank pain, decreased urine volume, difficulty urinating and dyspareunia.  Musculoskeletal: Negative for back pain, joint swelling, arthralgias and gait problem.  Neurological: Negative for dizziness, tremors, seizures, syncope, facial asymmetry, speech difficulty, weakness, light-headedness, numbness and  headaches.  Hematological: Negative for adenopathy. Does not bruise/bleed easily.  Psychiatric/Behavioral: Negative for hallucinations, behavioral problems, confusion, dysphoric mood, decreased concentration and agitation.    Objective:   Filed Vitals:   09/14/12 1528  BP: 132/75  Pulse: 72  Temp: 98.8 F (37.1 C)  Resp: 16    Physical Exam  Constitutional: Appears well-developed and well-nourished. No distress.  HENT: Normocephalic. External right and left ear normal. Oropharynx is clear and moist.  Eyes: Conjunctivae and EOM are normal. PERRLA, no scleral icterus.  Neck: Normal ROM. Neck supple. No JVD. No tracheal deviation. No thyromegaly.  CVS: RRR, S1/S2 +, no murmurs, no gallops, no carotid bruit.  Pulmonary: Effort and breath sounds normal, no stridor, rhonchi, wheezes, rales.  Abdominal: Soft. BS +,  no distension, tenderness, rebound or guarding.  Musculoskeletal: Normal range of motion. No edema and no tenderness.  Lymphadenopathy: No lymphadenopathy noted, cervical, inguinal. Neuro: Alert. Normal reflexes, muscle tone coordination. No cranial nerve deficit. Skin: Skin is warm and dry. No rash noted. Not diaphoretic. No erythema. No pallor.  Psychiatric: Normal mood and affect. Behavior, judgment, thought content normal.   Lab Results  Component Value Date   WBC 7.4 09/08/2012   HGB 15.3 09/08/2012   HCT 43.8 09/08/2012   MCV 89.0 09/08/2012   PLT 229 09/08/2012   Lab Results  Component Value Date   CREATININE 0.94 09/08/2012   BUN 14 09/08/2012   NA 142 09/08/2012   K 4.3 09/08/2012   CL 106 09/08/2012   CO2 24 09/08/2012    Lab Results  Component Value Date   HGBA1C 6.5* 09/08/2012   Lipid Panel     Component Value Date/Time   CHOL 204* 09/08/2012 0902   TRIG 188* 09/08/2012 0902   HDL 37* 09/08/2012 0902   CHOLHDL 5.5 09/08/2012 0902   VLDL 38 09/08/2012 0902   LDLCALC 129* 09/08/2012 0902       Assessment and plan:   Patient Active Problem List    Diagnosis Date Noted  . Dyslipidemia 09/14/2012  . Type 2 diabetes mellitus 09/14/2012  . HYPERLIPIDEMIA 11/23/2006  . ERECTILE DYSFUNCTION 11/23/2006   Type 2 DM, pt counseled on diet and exercise today.  Pt was encouraged to walk 30 mins 5 times per week.  Pt was encouraged to start taking his crestor 20 mg daily again as he had stopped taking it.  Recheck labs in 3 months  Follow up in 3 months  The patient was given clear instructions to go to ER or return to medical center if symptoms don't improve, worsen or new problems develop.  The patient verbalized understanding.  The patient was told to call to get lab results if they haven't heard anything in the next week.    Rodney Langton, MD, CDE, FAAFP [facility] [place], [place]

## 2013-02-03 ENCOUNTER — Encounter: Payer: Self-pay | Admitting: Internal Medicine

## 2013-02-03 ENCOUNTER — Ambulatory Visit: Payer: Self-pay | Attending: Internal Medicine | Admitting: Internal Medicine

## 2013-02-03 VITALS — BP 169/68 | HR 78 | Temp 98.6°F | Resp 18 | Wt 182.0 lb

## 2013-02-03 DIAGNOSIS — E785 Hyperlipidemia, unspecified: Secondary | ICD-10-CM

## 2013-02-03 DIAGNOSIS — Z0000 Encounter for general adult medical examination without abnormal findings: Secondary | ICD-10-CM

## 2013-02-03 MED ORDER — ROSUVASTATIN CALCIUM 20 MG PO TABS: 20.0000 mg | ORAL_TABLET | Freq: Every day | ORAL | Status: DC

## 2013-02-03 NOTE — Progress Notes (Signed)
Pt is here for a f/u for prostate and DM Voices no new concerns. Ambulated well to exam room w/NAD Alert w/no signs of acute distress.

## 2013-02-03 NOTE — Patient Instructions (Signed)

## 2013-02-03 NOTE — Progress Notes (Signed)
Patient ID: Connor Murray, male   DOB: Aug 12, 1950, 62 y.o.   MRN: [id number]  CC: follow up blood work  HPI: 62 year old male with past medical history dyslipidemia who presents to clinic for followup of blood work. Patient reports no complaints at this time. No chest pain or shortness of breath. No blurry vision or headaches. No diarrhea or constipation and no blood in the stool.  No Known Allergies History reviewed. No pertinent past medical history. No current outpatient prescriptions on file prior to visit.   No current facility-administered medications on file prior to visit.   No family history on file. History   Social History  . Marital Status: Divorced    Spouse Name: N/A    Number of Children: N/A  . Years of Education: N/A   Occupational History  . cab driver    Social History Main Topics  . Smoking status: Never Smoker   . Smokeless tobacco: Not on file  . Alcohol Use: Yes  . Drug Use: No  . Sexual Activity: Yes   Other Topics Concern  . Not on file   Social History Narrative  . No narrative on file    Review of Systems  Constitutional: Negative for fever, chills, diaphoresis, activity change, appetite change and fatigue.  HENT: Negative for ear pain, nosebleeds, congestion, facial swelling, rhinorrhea, neck pain, neck stiffness and ear discharge.   Eyes: Negative for pain, discharge, redness, itching and visual disturbance.  Respiratory: Negative for cough, choking, chest tightness, shortness of breath, wheezing and stridor.   Cardiovascular: Negative for chest pain, palpitations and leg swelling.  Gastrointestinal: Negative for abdominal distention.  Genitourinary: Negative for dysuria, urgency, frequency, hematuria, flank pain, decreased urine volume, difficulty urinating and dyspareunia.  Musculoskeletal: Negative for back pain, joint swelling, arthralgias and gait problem.  Neurological: Negative for dizziness, tremors, seizures, syncope, facial  asymmetry, speech difficulty, weakness, light-headedness, numbness and headaches.  Hematological: Negative for adenopathy. Does not bruise/bleed easily.  Psychiatric/Behavioral: Negative for hallucinations, behavioral problems, confusion, dysphoric mood, decreased concentration and agitation.    Objective:   Filed Vitals:   02/03/13 1424  BP: 169/68  Pulse: 78  Temp: 98.6 F (37 C)  Resp: 18    Physical Exam  Constitutional: Appears well-developed and well-nourished. No distress.  HENT: Normocephalic. External right and left ear normal. Oropharynx is clear and moist.  Eyes: Conjunctivae and EOM are normal. PERRLA, no scleral icterus.  Neck: Normal ROM. Neck supple. No JVD. No tracheal deviation. No thyromegaly.  CVS: RRR, S1/S2 +, no murmurs, no gallops, no carotid bruit.  Pulmonary: Effort and breath sounds normal, no stridor, rhonchi, wheezes, rales.  Abdominal: Soft. BS +,  no distension, tenderness, rebound or guarding.  Musculoskeletal: Normal range of motion. No edema and no tenderness.  Lymphadenopathy: No lymphadenopathy noted, cervical, inguinal. Neuro: Alert. Normal reflexes, muscle tone coordination. No cranial nerve deficit. Skin: Skin is warm and dry. No rash noted. Not diaphoretic. No erythema. No pallor.  Psychiatric: Normal mood and affect. Behavior, judgment, thought content normal.   Lab Results  Component Value Date   WBC 7.4 09/08/2012   HGB 15.3 09/08/2012   HCT 43.8 09/08/2012   MCV 89.0 09/08/2012   PLT 229 09/08/2012   Lab Results  Component Value Date   CREATININE 0.94 09/08/2012   BUN 14 09/08/2012   NA 142 09/08/2012   K 4.3 09/08/2012   CL 106 09/08/2012   CO2 24 09/08/2012    Lab Results  Component Value Date  HGBA1C 6.5* 09/08/2012   Lipid Panel     Component Value Date/Time   CHOL 204* 09/08/2012 0902   TRIG 188* 09/08/2012 0902   HDL 37* 09/08/2012 0902   CHOLHDL 5.5 09/08/2012 0902   VLDL 38 09/08/2012 0902   LDLCALC 129* 09/08/2012 0902        Assessment and plan:   Patient Active Problem List   Diagnosis Date Noted  . Preventative health care 02/03/2013    Priority: Medium - check TSH, lipid panel and A1c - BP 169/68 but pt reports having hite coat hypertension - will recheck BP during next visit - discussed lifestyle modifications with the pt  . HYPERLIPIDEMIA 11/23/2006    Priority: Medium - continue rosuvastatin

## 2013-02-04 LAB — LIPID PANEL
Cholesterol: 180 mg/dL (ref 0–200)
LDL Cholesterol: 121 mg/dL — ABNORMAL HIGH (ref 0–99)
VLDL: 18 mg/dL (ref 0–40)

## 2013-02-10 ENCOUNTER — Ambulatory Visit: Payer: No Typology Code available for payment source | Attending: Internal Medicine

## 2013-02-10 DIAGNOSIS — Z23 Encounter for immunization: Secondary | ICD-10-CM

## 2013-03-02 ENCOUNTER — Ambulatory Visit: Payer: No Typology Code available for payment source | Attending: Internal Medicine | Admitting: Internal Medicine

## 2013-03-02 ENCOUNTER — Encounter: Payer: Self-pay | Admitting: Internal Medicine

## 2013-03-02 VITALS — BP 143/80 | HR 59 | Temp 98.2°F | Ht 66.0 in | Wt 180.8 lb

## 2013-03-02 DIAGNOSIS — Z0000 Encounter for general adult medical examination without abnormal findings: Secondary | ICD-10-CM | POA: Insufficient documentation

## 2013-03-02 DIAGNOSIS — E785 Hyperlipidemia, unspecified: Secondary | ICD-10-CM

## 2013-03-02 LAB — POCT GLYCOSYLATED HEMOGLOBIN (HGB A1C): Hemoglobin A1C: 5.8

## 2013-03-02 MED ORDER — ROSUVASTATIN CALCIUM 20 MG PO TABS: 20.0000 mg | ORAL_TABLET | Freq: Every day | ORAL | Status: DC

## 2013-03-02 NOTE — Patient Instructions (Signed)
Exercise to Lose Weight Exercise and a healthy diet may help you lose weight. Your doctor may suggest specific exercises. EXERCISE IDEAS AND TIPS  Choose low-cost things you enjoy doing, such as walking, bicycling, or exercising to workout videos.  Take stairs instead of the elevator.  Walk during your lunch break.  Park your car further away from work or school.  Go to a gym or an exercise class.  Start with 5 to 10 minutes of exercise each day. Build up to 30 minutes of exercise 4 to 6 days a week.  Wear shoes with good support and comfortable clothes.  Stretch before and after working out.  Work out until you breathe harder and your heart beats faster.  Drink extra water when you exercise.  Do not do so much that you hurt yourself, feel dizzy, or get very short of breath. Exercises that burn about 150 calories:  Running 1  miles in 15 minutes.  Playing volleyball for 45 to 60 minutes.  Washing and waxing a car for 45 to 60 minutes.  Playing touch football for 45 minutes.  Walking 1  miles in 35 minutes.  Pushing a stroller 1  miles in 30 minutes.  Playing basketball for 30 minutes.  Raking leaves for 30 minutes.  Bicycling 5 miles in 30 minutes.  Walking 2 miles in 30 minutes.  Dancing for 30 minutes.  Shoveling snow for 15 minutes.  Swimming laps for 20 minutes.  Walking up stairs for 15 minutes.  Bicycling 4 miles in 15 minutes.  Gardening for 30 to 45 minutes.  Jumping rope for 15 minutes.  Washing windows or floors for 45 to 60 minutes. Document Released: 05/02/2010 Document Revised: 06/22/2011 Document Reviewed: 05/02/2010 [facility] Patient Information 2014 [place], [place]. Hypercholesterolemia High Blood Cholesterol Cholesterol is a white, waxy, fat-like protein needed by your body in small amounts. The liver makes all the cholesterol you need. It is carried from the liver by the blood through the blood vessels. Deposits (plaque) may build  up on blood vessel walls. This makes the arteries narrower and stiffer. Plaque increases the risk for heart attack and stroke. You cannot feel your cholesterol level even if it is very high. The only way to know is by a blood test to check your lipid (fats) levels. Once you know your cholesterol levels, you should keep a record of the test results. Work with your caregiver to to keep your levels in the desired range. WHAT THE RESULTS MEAN:  Total cholesterol is a rough measure of all the cholesterol in your blood.  LDL is the so-called bad cholesterol. This is the type that deposits cholesterol in the walls of the arteries. You want this level to be low.  HDL is the good cholesterol because it cleans the arteries and carries the LDL away. You want this level to be high.  Triglycerides are fat that the body can either burn for energy or store. High levels are closely linked to heart disease. DESIRED LEVELS:  Total cholesterol below 200.  LDL below 100 for people at risk, below 70 for very high risk.  HDL above 50 is good, above 60 is best.  Triglycerides below 150. HOW TO LOWER YOUR CHOLESTEROL:  Diet.  Choose fish or white meat chicken and [place], roasted or baked. Limit fatty cuts of red meat, fried foods, and processed meats, such as sausage and lunch meat.  Eat lots of fresh fruits and vegetables. Choose whole grains, beans, pasta, potatoes and cereals.  Use only small amounts of olive, corn or canola oils. Avoid butter, mayonnaise, shortening or palm kernel oils. Avoid foods with trans-fats.  Use skim/nonfat milk and low-fat/nonfat yogurt and cheeses. Avoid whole milk, cream, ice cream, egg yolks and cheeses. Healthy desserts include angel food cake, gingersnaps, animal crackers, hard candy, popsicles, and low-fat/nonfat frozen yogurt. Avoid pastries, cakes, pies and cookies.  Exercise.  A regular program helps decrease LDL and raises HDL.  Helps with weight control.  Do  things that increase your activity level like gardening, walking, or taking the stairs.  Medication.  May be prescribed by your caregiver to help lowering cholesterol and the risk for heart disease.  You may need medicine even if your levels are normal if you have several risk factors. HOME CARE INSTRUCTIONS   Follow your diet and exercise programs as suggested by your caregiver.  Take medications as directed.  Have blood work done when your caregiver feels it is necessary. MAKE SURE YOU:   Understand these instructions.  Will watch your condition.  Will get help right away if you are not doing well or get worse. Document Released: 03/30/2005 Document Revised: 06/22/2011 Document Reviewed: 09/15/2006 [facility] Patient Information 2014 [place], [place].

## 2013-03-02 NOTE — Progress Notes (Signed)
Patient ID: Connor Murray, male   DOB: 1950-08-02, 62 y.o.   MRN: [id number] Patient Demographics  Hulen Mandler, is a 62 y.o. male  [id number]:[id number]  [id number]  DOB - 01/03/1951  Chief Complaint  Patient presents with  . office visit    referred for protaste and colon exam        Subjective:   Connor Murray is a 62 y.o. male here today for a follow up visit. Patient claims he needs his prostate checked and wants a referral for colonoscopy. He has no complaints today. He also wants a refill of his Crestor. He claims to be doing some exercise, eating right. He does not smoke cigarette, he does not drink alcohol. Patient has No headache, No chest pain, No abdominal pain - No Nausea, No new weakness tingling or numbness, No Cough - SOB.  ALLERGIES: No Known Allergies  PAST MEDICAL HISTORY: No past medical history on file.  MEDICATIONS AT HOME: Prior to Admission medications   Medication Sig Start Date End Date Taking? Authorizing Provider  rosuvastatin (CRESTOR) 20 MG tablet Take 1 tablet (20 mg total) by mouth daily. 03/02/13  Yes Jeanann Lewandowsky, MD     Objective:   Filed Vitals:   03/02/13 1429  BP: 143/80  Pulse: 59  Temp: 98.2 F (36.8 C)  TempSrc: Oral  Height: 5\' 6"  (1.676 m)  Weight: 180 lb 12.8 oz (82.01 kg)  SpO2: 96%    Exam General appearance : Awake, alert, not in any distress. Speech Clear. Not toxic looking HEENT: Atraumatic and Normocephalic, pupils equally reactive to light and accomodation Neck: supple, no JVD. No cervical lymphadenopathy.  Chest:Good air entry bilaterally, no added sounds  CVS: S1 S2 regular, no murmurs.  Abdomen: Bowel sounds present, Non tender and not distended with no gaurding, rigidity or rebound. Extremities: B/L Lower Ext shows no edema, both legs are warm to touch Neurology: Awake alert, and oriented X 3, CN II-XII intact, Non focal Skin:No Rash Wounds:N/A   Data Review   CBC No results found for this  basename: WBC, HGB, HCT, PLT, MCV, MCH, MCHC, RDW, NEUTRABS, LYMPHSABS, MONOABS, EOSABS, BASOSABS, BANDABS, BANDSABD,  in the last 168 hours  Chemistries   No results found for this basename: NA, K, CL, CO2, GLUCOSE, BUN, CREATININE, GFRCGP, CALCIUM, MG, AST, ALT, ALKPHOS, BILITOT,  in the last 168 hours ------------------------------------------------------------------------------------------------------------------  Recent Labs  03/02/13 1526  HGBA1C 5.8   ------------------------------------------------------------------------------------------------------------------ No results found for this basename: CHOL, HDL, LDLCALC, TRIG, CHOLHDL, LDLDIRECT,  in the last 72 hours ------------------------------------------------------------------------------------------------------------------ No results found for this basename: TSH, T4TOTAL, FREET3, T3FREE, THYROIDAB,  in the last 72 hours ------------------------------------------------------------------------------------------------------------------ No results found for this basename: VITAMINB12, FOLATE, FERRITIN, TIBC, IRON, RETICCTPCT,  in the last 72 hours  Coagulation profile  No results found for this basename: INR, PROTIME,  in the last 168 hours    Assessment & Plan   1. Preventative health care - PSA for prostate cancer screening on patient's request - HgB A1c - [facility] COLONOSCOPY - Ambulatory referral to Gastroenterology for colonoscopy  2. Dyslipidemia Refill - rosuvastatin (CRESTOR) 20 MG tablet; Take 1 tablet (20 mg total) by mouth daily.  Dispense: 90 tablet; Refill: 4   Follow up in 3 months or when necessary   The patient was given clear instructions to go to ER or return to medical center if symptoms don't improve, worsen or new problems develop. The patient verbalized understanding. The patient was told to call to get lab results  if they haven't heard anything in the next week.    Jeanann Lewandowsky, MD, MHA,  FACP, FAAP [facility] [place], [place] [phone]   03/02/2013, 5:20 PM

## 2013-03-02 NOTE — Progress Notes (Signed)
Pt is here on a referral to has a prostate exam and a colon exam completed.

## 2013-03-03 ENCOUNTER — Telehealth: Payer: Self-pay

## 2013-03-03 NOTE — Telephone Encounter (Signed)
Patient is aware of his lab results 

## 2013-03-03 NOTE — Telephone Encounter (Signed)
Message copied by Lestine Mount on Fri Mar 03, 2013 11:39 AM ------      Message from: Quentin Angst      Created: Fri Mar 03, 2013 10:23 AM       Please call patient to let him know that his PSA (prostate) is normal. In his hemoglobin A1c for blood sugar is normal. We recommend continue physical exercise, and low carbohydrate low cholesterol diet, ------

## 2013-03-21 ENCOUNTER — Encounter: Payer: Self-pay | Admitting: Internal Medicine

## 2013-05-16 ENCOUNTER — Ambulatory Visit: Payer: No Typology Code available for payment source | Attending: Internal Medicine

## 2013-05-23 ENCOUNTER — Encounter: Payer: Self-pay | Admitting: Internal Medicine

## 2013-06-01 ENCOUNTER — Telehealth: Payer: Self-pay | Admitting: Internal Medicine

## 2013-06-01 DIAGNOSIS — Z0000 Encounter for general adult medical examination without abnormal findings: Secondary | ICD-10-CM

## 2013-06-01 NOTE — Telephone Encounter (Signed)
Pt has [facility] orange card and needs colonoscopy referral.  Pt was referred to [facility] except they are [facility] and the referral was cancelled. [facility].  Please f/u with appt info when ready.

## 2013-06-01 NOTE — Telephone Encounter (Signed)
Can we refer the patient to [facility]? [place] told them not taking [facility]

## 2013-06-01 NOTE — Telephone Encounter (Signed)
I'm sorry we can't the only place is [facility]  [facility]

## 2013-06-05 ENCOUNTER — Ambulatory Visit: Payer: Self-pay | Admitting: Internal Medicine

## 2014-06-01 ENCOUNTER — Ambulatory Visit: Payer: Self-pay | Attending: Internal Medicine

## 2014-08-20 ENCOUNTER — Encounter: Payer: Self-pay | Admitting: Internal Medicine

## 2014-08-20 ENCOUNTER — Ambulatory Visit: Payer: Self-pay | Attending: Internal Medicine | Admitting: Internal Medicine

## 2014-08-20 ENCOUNTER — Ambulatory Visit (HOSPITAL_COMMUNITY)
Admission: RE | Admit: 2014-08-20 | Discharge: 2014-08-20 | Disposition: A | Discharge status: Home / Self Care | Payer: Self-pay | Source: Ambulatory Visit | Attending: Internal Medicine | Admitting: Internal Medicine

## 2014-08-20 VITALS — BP 153/76 | HR 55 | Temp 98.1°F | Resp 16 | Ht 66.0 in | Wt 179.0 lb

## 2014-08-20 DIAGNOSIS — Z0000 Encounter for general adult medical examination without abnormal findings: Secondary | ICD-10-CM | POA: Insufficient documentation

## 2014-08-20 DIAGNOSIS — M25532 Pain in left wrist: Secondary | ICD-10-CM | POA: Insufficient documentation

## 2014-08-20 DIAGNOSIS — E785 Hyperlipidemia, unspecified: Secondary | ICD-10-CM | POA: Insufficient documentation

## 2014-08-20 DIAGNOSIS — F528 Other sexual dysfunction not due to a substance or known physiological condition: Secondary | ICD-10-CM | POA: Insufficient documentation

## 2014-08-20 LAB — POCT GLYCOSYLATED HEMOGLOBIN (HGB A1C): HEMOGLOBIN A1C: 6.5

## 2014-08-20 MED ORDER — SILDENAFIL CITRATE 100 MG PO TABS: 50.0000 mg | ORAL_TABLET | Freq: Every day | ORAL | Status: DC | PRN

## 2014-08-20 MED ORDER — ROSUVASTATIN CALCIUM 20 MG PO TABS: 20.0000 mg | ORAL_TABLET | Freq: Every day | ORAL | Status: DC

## 2014-08-20 NOTE — Progress Notes (Signed)
Pt comes in today for wellness check-up Requesting blood work cholesterol and arthritis C/o left wrist pain,nonradiating x 2 years No swelling noted. Copper hand brace on

## 2014-08-20 NOTE — Patient Instructions (Signed)
Wrist Exercises RANGE OF MOTION (ROM) AND STRETCHING EXERCISES - Wrist Sprain  These exercises may help you when beginning to rehabilitate your injury. Your symptoms may resolve with or without further involvement from your physician, physical therapist, or athletic trainer. While completing these exercises, remember:   Restoring tissue flexibility helps normal motion to return to the joints. This allows healthier, less painful movement and activity.  An effective stretch should be held for at least 30 seconds.  A stretch should never be painful. You should only feel a gentle lengthening or release in the stretched tissue. RANGE OF MOTION - Wrist Flexion, Active-Assisted  Extend your right / left elbow with your palm pointing down.*  Gently pull the back of your hand toward you until you feel a gentle stretch on the top of your forearm.  Hold this position for __________ seconds. Repeat __________ times. Complete this exercise __________ times per day.  *If directed by your physician, physical therapist, or athletic trainer, complete this stretch with your elbow bent rather than extended. RANGE OF MOTION - Wrist Extension, Active-Assisted   Extend your right / left elbow and turn your palm upward.*  Gently pull your palm/fingertips back so your wrist extends and your fingers point more toward the ground.  You should feel a gentle stretch on the inside of your forearm.  Hold this position for __________ seconds. Repeat __________ times. Complete this exercise __________ times per day. *If directed by your physician, physical therapist, or athletic trainer, complete this stretch with your elbow bent, rather than extended. RANGE OF MOTION - Supination, Active   Stand or sit with your elbows at your side. Bend your right / left elbow to 90 degrees.  Turn your palm upward until you feel a gentle stretch on the inside of your forearm.  Hold this position for __________ seconds. Slowly  release and return to the starting position. Repeat __________ times. Complete this stretch __________ times per day.  RANGE OF MOTION - Pronation, Active   Stand or sit with your elbows at your side. Bend your right / left elbow to 90 degrees.  Turn your palm downward until you feel a gentle stretch on the top of your forearm.  Hold this position for __________ seconds. Slowly release and return to the starting position. Repeat __________ times. Complete this stretch __________ times per day.  STRENGTHENING EXERCISES  These exercises may help you when beginning to rehabilitate your injury. They may resolve your symptoms with or without further involvement from your physician, physical therapist, or athletic trainer. While completing these exercises, remember:   Muscles can gain both the endurance and the strength needed for everyday activities through controlled exercises.  Complete these exercises as instructed by your physician, physical therapist, or athletic trainer. Progress the resistance and repetitions only as guided.  You may experience muscle soreness or fatigue, but the pain or discomfort you are trying to eliminate should never worsen during these exercises. If this pain does worsen, stop and make certain you are following the directions exactly. If the pain is still present after adjustments, discontinue the exercise until you can discuss the trouble with your clinician. STRENGTH - Wrist Flexors  Sit with your right / left forearm palm-up and fully supported. Your elbow should be resting below the height of your shoulder. Allow your wrist to extend over the edge of the surface.  Loosely holding a __________ weight or a piece of rubber exercise band/tubing, slowly curl your hand up toward your forearm.    Hold this position for __________ seconds. Slowly lower the wrist back to the starting position in a controlled manner. Repeat __________ times. Complete this exercise __________  times per day.  STRENGTH - Wrist Extensors  Sit with your right / left forearm palm-down and fully supported. Your elbow should be resting below the height of your shoulder. Allow your wrist to extend over the edge of the surface.  Loosely holding a __________ weight or a piece of rubber exercise band/tubing, slowly curl your hand up toward your forearm.  Hold this position for __________ seconds. Slowly lower the wrist back to the starting position in a controlled manner. Repeat __________ times. Complete this exercise __________ times per day.  STRENGTH - Forearm Supinators  Sit with your right / left forearm supported on a table, keeping your elbow below shoulder height. Rest your hand over the edge, palm down.  Gently grip a hammer or a soup ladle.  Without moving your elbow, slowly turn your palm and hand upward to a "thumbs-up" position.  Hold this position for __________ seconds. Slowly return to the starting position. Repeat __________ times. Complete this exercise __________ times per day.  STRENGTH - Forearm Pronators   Sit with your right / left forearm supported on a table, keeping your elbow below shoulder height. Rest your hand over the edge, palm up.  Gently grip a hammer or a soup ladle.  Without moving your elbow, slowly turn your palm and hand upward to a "thumbs-up" position.  Hold this position for __________ seconds. Slowly return to the starting position. Repeat __________ times. Complete this exercise __________ times per day.  STRENGTH - Grip  Grasp a tennis ball, a dense sponge, or a large, rolled sock in your hand.  Squeeze as hard as you can without increasing any pain.  Hold this position for __________ seconds. Release your grip slowly. Repeat __________ times. Complete this exercise __________ times per day.  Document Released: 02/11/2005 Document Revised: 08/14/2013 Document Reviewed: 07/12/2008 [employer] Patient Information 2015 [employer].  This information is not intended to replace advice given to you by your health care provider. Make sure you discuss any questions you have with your health care provider.  

## 2014-08-20 NOTE — Progress Notes (Signed)
Patient ID: Connor Murray, male   DOB: 12/22/1950, 64 y.o.   MRN: [id number]   Connor Murray, is a 64 y.o. male  [id number]  [id number]  DOB - 03/28/1951  Chief Complaint  Patient presents with  . Follow-up  . Wrist Pain        Subjective:   Connor Murray is a 64 y.o. male here today for a follow up visit. Patient has been lost to follow-up since 2014. He has no significant medical history except for erectile dysfunction and dyslipidemia for which he takes Viagra and Crestor respectively. Patient is here today for refill of these medications and also requesting lipid profile. Patient does not smoke cigarettes, he does not drink alcohol. He works as a [employer] driver and for quite some time now he is being having pain on his left wrist with occasional swelling and some tingling of his fingertips. Right now there is no swelling and no redness but there is pain especially when he moves the joint, or when he tries to lift an object. He denies any trauma. No history of arthritis. Patient has No headache, No chest pain, No abdominal pain - No Nausea, No new weakness tingling or numbness, No Cough - SOB.  Problem  ERECTILE DYSFUNCTION  Left Wrist Pain    ALLERGIES: No Known Allergies  PAST MEDICAL HISTORY: History reviewed. No pertinent past medical history.  MEDICATIONS AT HOME: Prior to Admission medications   Medication Sig Start Date End Date Taking? Authorizing Provider  rosuvastatin (CRESTOR) 20 MG tablet Take 1 tablet (20 mg total) by mouth daily. 08/20/14   Quentin Angstlugbemiga E Jetty Berland, MD  sildenafil (VIAGRA) 100 MG tablet Take 0.5 tablets (50 mg total) by mouth daily as needed for erectile dysfunction. 08/20/14   Quentin Angstlugbemiga E Vaunda Gutterman, MD     Objective:   Filed Vitals:   08/20/14 1413  BP: 153/76  Pulse: 55  Temp: 98.1 F (36.7 C)  TempSrc: Oral  Resp: 16  Height: 5\' 6"  (1.676 m)  Weight: 179 lb (81.194 kg)  SpO2: 96%    Exam General appearance : Awake, alert, not in any  distress. Speech Clear. Not toxic looking HEENT: Atraumatic and Normocephalic, pupils equally reactive to light and accomodation Neck: supple, no JVD. No cervical lymphadenopathy.  Chest:Good air entry bilaterally, no added sounds  CVS: S1 S2 regular, no murmurs.  Abdomen: Bowel sounds present, Non tender and not distended with no gaurding, rigidity or rebound. Extremities: No obvious swelling or redness of the left wrist joint, range of motion is intact, negative signs of carpal tunnel syndrome. B/L Lower Ext shows no edema, both legs are warm to touch Neurology: Awake alert, and oriented X 3, CN II-XII intact, Non focal Skin:No Rash  Data Review Lab Results  Component Value Date   HGBA1C 5.8 03/02/2013   HGBA1C 6.5* 09/08/2012   HGBA1C 6.4* 02/18/2010     Assessment & Plan   1. Dyslipidemia  - Lipid panel - rosuvastatin (CRESTOR) 20 MG tablet; Take 1 tablet (20 mg total) by mouth daily.  Dispense: 90 tablet; Refill: 3  2. Preventative health care  - COMPLETE METABOLIC PANEL WITH GFR - POCT glycosylated hemoglobin (Hb A1C)  3. ERECTILE DYSFUNCTION  - PSA - sildenafil (VIAGRA) 100 MG tablet; Take 0.5 tablets (50 mg total) by mouth daily as needed for erectile dysfunction.  Dispense: 90 tablet; Refill: 3  4. Left wrist pain  - DG Wrist Complete Left; Future  Patient have been counseled extensively about nutrition and exercise  Return in about 6 months (around 02/20/2015) for Routine Follow Up, dyslipidemia.  The patient was given clear instructions to go to ER or return to medical center if symptoms don't improve, worsen or new problems develop. The patient verbalized understanding. The patient was told to call to get lab results if they haven't heard anything in the next week.   This note has been created with Education officer, environmentalDragon speech recognition software and smart phrase technology. Any transcriptional errors are unintentional.    Jeanann LewandowskyJEGEDE, Micheala Morissette, MD, MHA, CPE, FACP, FAAP [facility] [place], [place] [phone]   08/20/2014, 3:05 PM

## 2014-08-21 LAB — COMPLETE METABOLIC PANEL WITH GFR
ALT: 30 U/L (ref 0–53)
AST: 31 U/L (ref 0–37)
Albumin: 4.3 g/dL (ref 3.5–5.2)
Alkaline Phosphatase: 55 U/L (ref 39–117)
BUN: 14 mg/dL (ref 6–23)
CALCIUM: 10.1 mg/dL (ref 8.4–10.5)
CHLORIDE: 103 meq/L (ref 96–112)
CO2: 27 meq/L (ref 19–32)
Creat: 0.95 mg/dL (ref 0.50–1.35)
GFR, EST NON AFRICAN AMERICAN: 84 mL/min
GFR, Est African American: >89 mL/min
Glucose, Bld: 81 mg/dL (ref 70–99)
POTASSIUM: 5.4 meq/L — AB (ref 3.5–5.3)
Sodium: 140 mEq/L (ref 135–145)
Total Bilirubin: 0.4 mg/dL (ref 0.2–1.2)
Total Protein: 7.9 g/dL (ref 6.0–8.3)

## 2014-08-21 LAB — LIPID PANEL
CHOL/HDL RATIO: 4 ratio
Cholesterol: 196 mg/dL (ref 0–200)
HDL: 49 mg/dL (ref >=40)
LDL Cholesterol: 90 mg/dL (ref 0–99)
Triglycerides: 287 mg/dL — ABNORMAL HIGH (ref <150)
VLDL: 57 mg/dL — AB (ref 0–40)

## 2014-08-21 LAB — PSA: PSA: 1.52 ng/mL (ref <=4.00)

## 2014-08-22 ENCOUNTER — Telehealth: Payer: Self-pay | Admitting: Emergency Medicine

## 2014-08-22 NOTE — Telephone Encounter (Signed)
Pt given test results with options. Pt prefers to start prescribed diabetes medication now. Metformin 500 mg tab BID ordered and e-scribed to [facility] pharmacy Pt informed medication may cause GI upset for the first 2 weeks then sx's will subside, and if not, call clinic to speak with provider for alternative. Pt also given xray results  Verbalized understanding

## 2014-08-22 NOTE — Telephone Encounter (Signed)
-----   Message from Quentin Angstlugbemiga E Jegede, MD sent at 08/20/2014  4:21 PM EDT ----- Please inform patient that his hemoglobin A1c shows that he is diabetic, we have 2 options 1) controlled with diet and exercise and repeat hemoglobin A1c in 3 months and if hemoglobin A1c remains the same or higher will start new medication for diabetes, 2) start the medication for diabetes now

## 2014-08-22 NOTE — Telephone Encounter (Signed)
-----   Message from Quentin Angstlugbemiga E Jegede, MD sent at 08/20/2014  5:16 PM EDT ----- Please inform patient that his x-ray shows arthritis with no acute abnormalities.

## 2014-08-22 NOTE — Telephone Encounter (Signed)
Left message with test results Instructed pt to call clinic for further questions or concerns

## 2014-09-05 ENCOUNTER — Telehealth: Payer: Self-pay | Admitting: *Deleted

## 2014-09-05 DIAGNOSIS — E875 Hyperkalemia: Secondary | ICD-10-CM

## 2014-09-05 DIAGNOSIS — E7800 Pure hypercholesterolemia, unspecified: Secondary | ICD-10-CM

## 2014-09-05 NOTE — Telephone Encounter (Signed)
Gave results to patient, placed orders in Epic and scheduled future lab visit.

## 2014-09-05 NOTE — Telephone Encounter (Signed)
-----   Message from Quentin Angstlugbemiga E Jegede, MD sent at 08/29/2014  5:26 PM EDT ----- Please inform patient that his laboratory test results are mostly within normal limits however his cholesterol level is slightly high. His potassium level is also mildly elevated. We will need to repeat these labs, please schedule patient for lab visit for fasting lipid profile and repeat serum potassium.

## 2014-09-06 ENCOUNTER — Ambulatory Visit: Payer: Self-pay | Attending: Internal Medicine

## 2014-09-06 DIAGNOSIS — E875 Hyperkalemia: Secondary | ICD-10-CM

## 2014-09-06 LAB — BASIC METABOLIC PANEL
BUN: 13 mg/dL (ref 6–23)
CO2: 28 mEq/L (ref 19–32)
Calcium: 10.1 mg/dL (ref 8.4–10.5)
Chloride: 104 mEq/L (ref 96–112)
Creat: 0.94 mg/dL (ref 0.50–1.35)
Glucose, Bld: 110 mg/dL — ABNORMAL HIGH (ref 70–99)
Potassium: 5.1 mEq/L (ref 3.5–5.3)
SODIUM: 140 meq/L (ref 135–145)

## 2014-09-20 ENCOUNTER — Ambulatory Visit: Payer: No Typology Code available for payment source | Attending: Internal Medicine

## 2014-09-28 ENCOUNTER — Other Ambulatory Visit: Payer: Self-pay | Admitting: Internal Medicine

## 2014-09-28 DIAGNOSIS — E785 Hyperlipidemia, unspecified: Secondary | ICD-10-CM

## 2014-09-28 DIAGNOSIS — F528 Other sexual dysfunction not due to a substance or known physiological condition: Secondary | ICD-10-CM

## 2014-09-28 MED ORDER — SILDENAFIL CITRATE 100 MG PO TABS: 50.0000 mg | ORAL_TABLET | Freq: Every day | ORAL | Status: DC | PRN

## 2014-09-28 MED ORDER — ROSUVASTATIN CALCIUM 20 MG PO TABS: 20.0000 mg | ORAL_TABLET | Freq: Every day | ORAL | Status: DC

## 2014-11-16 ENCOUNTER — Ambulatory Visit: Payer: Self-pay | Attending: Internal Medicine

## 2015-01-04 ENCOUNTER — Ambulatory Visit: Payer: Self-pay | Attending: Family Medicine | Admitting: Family Medicine

## 2015-01-04 ENCOUNTER — Encounter: Payer: Self-pay | Admitting: Family Medicine

## 2015-01-04 VITALS — BP 129/72 | HR 62 | Temp 98.4°F | Resp 20 | Ht 66.0 in | Wt 183.0 lb

## 2015-01-04 DIAGNOSIS — N400 Enlarged prostate without lower urinary tract symptoms: Secondary | ICD-10-CM

## 2015-01-04 DIAGNOSIS — R350 Frequency of micturition: Secondary | ICD-10-CM

## 2015-01-04 DIAGNOSIS — N401 Enlarged prostate with lower urinary tract symptoms: Secondary | ICD-10-CM | POA: Insufficient documentation

## 2015-01-04 MED ORDER — DOXAZOSIN MESYLATE 1 MG PO TABS: 1.0000 mg | ORAL_TABLET | Freq: Every day | ORAL | Status: DC

## 2015-01-04 NOTE — Progress Notes (Signed)
   Subjective:    Patient ID: Connor Murray, male    DOB: 1950-08-16, 64 y.o.   MRN: [id number]  HPI  Patient here with 3 complaints.   1. Left upper dental pain vs. TMJ Present for 6 months, not worsening. No fevers or chills, no otorrhea,. Worse when chewing.    2. Would like cryotherapy for two small skin lesions (R eyebrow, L posterior auricular)  3. C/o worsening nocturia and difficulty passing urine. No dysuria,  Was told he BPH 12 years ago when he lived in [place].  No blood in urine.   Social Hx; nonsmoker.   Review of Systems     Objective:   Physical Exam Well appearing, no distress HEENT neck supple. NO cervical adenopathy. Full active ROM. Tenderness over L TMJ, able to open jaw completey Dentition is poor; no active gingival erythema.  Moist mucus membranes, no salivary duct erythema or palpable stones on gloved oral exam. TMs clear bilaterally.  SKIN Raised round skin lesion over L eyebrow c/w wart; similar lesion L postauricular area. Each one measures  diameter.  DRE: Prostate enlarged and symmetric, smooth, nontender. No stool recovered on glove   Procedure Note; Cryo applied to the two skin lesions described above.  Tolerated well, no complications.  JB    Assessment & Plan:

## 2015-01-04 NOTE — Progress Notes (Signed)
Patient here for toothache. Persisting for 6 months. Cold water makes the pain worse. Pain is on left side of mouth. Chewing on left side brings pain as well. Scaled at a 10, described as aching pain. Pain medication did not help the toothache.  Patient has two bumps, one over right eye and one on back of head. Prior doctor used cryotherapy to remove previous bumps.  Patient has not taken medications today, only vitamins and cholesterol medication. Patient requesting Viagra refill. Patient states he only received 5 tablets last fill.  Patient wants his prostate checked today.

## 2015-01-04 NOTE — Patient Instructions (Signed)
It was a pleasure to see you today.   We froze the two skin lesions you requested.   For the prostate enlargement, doxazosin  by mouth daily.  Follow up in 1-2 months   I recommend that you see a dentist for the left-sided dental pain.

## 2015-01-21 ENCOUNTER — Telehealth: Payer: Self-pay | Admitting: Internal Medicine

## 2015-01-21 NOTE — Telephone Encounter (Signed)
Patient called requesting a medication refill for doxazosin (CARDURA) Please follow up.

## 2015-01-22 ENCOUNTER — Telehealth: Payer: Self-pay | Admitting: Internal Medicine

## 2015-01-22 ENCOUNTER — Ambulatory Visit: Payer: Self-pay | Attending: Internal Medicine

## 2015-01-22 DIAGNOSIS — Z0000 Encounter for general adult medical examination without abnormal findings: Secondary | ICD-10-CM

## 2015-01-22 DIAGNOSIS — Z23 Encounter for immunization: Secondary | ICD-10-CM | POA: Insufficient documentation

## 2015-01-22 NOTE — Telephone Encounter (Signed)
Patient verified DOB Patient made aware of Dr. Mauricio Po placing a refill on original prescription. Patient should have had 30 day supply from 9/23-10/23 with an additional refill. Patient will call pharmacy and fill last refill on medication. Patient had no other questions.

## 2015-01-22 NOTE — Telephone Encounter (Signed)
Patient came in requesting a medication refill for doxazosin (CARDURA) Patient received refill on 9/23 but misused the medication and took a whole pill instead of half. Patient feels that medication is ineffective Patient would like further instruction on what to do. Please follow up.

## 2015-01-22 NOTE — Progress Notes (Signed)
Pt's here for the flu vaccination. Pt's reports feeling well with no fever. Pt's waited 5 mins w/o adverse reacton to the vaccine.

## 2015-01-22 NOTE — Addendum Note (Signed)
Addended by: Elpidio Eric A on: 01/22/2015 05:39 PM   Modules accepted: Level of Service

## 2015-01-25 ENCOUNTER — Telehealth: Payer: Self-pay | Admitting: *Deleted

## 2015-01-25 NOTE — Telephone Encounter (Signed)
Error

## 2015-02-04 ENCOUNTER — Other Ambulatory Visit: Payer: Self-pay | Admitting: Family Medicine

## 2015-02-05 ENCOUNTER — Other Ambulatory Visit: Payer: Self-pay | Admitting: Family Medicine

## 2015-02-28 ENCOUNTER — Ambulatory Visit: Payer: Self-pay | Admitting: Internal Medicine

## 2015-03-15 ENCOUNTER — Other Ambulatory Visit: Payer: Self-pay | Admitting: *Deleted

## 2015-03-15 DIAGNOSIS — N400 Enlarged prostate without lower urinary tract symptoms: Secondary | ICD-10-CM

## 2015-03-15 MED ORDER — DOXAZOSIN MESYLATE 1 MG PO TABS: 1.0000 mg | ORAL_TABLET | Freq: Every day | ORAL | Status: DC

## 2015-03-21 ENCOUNTER — Ambulatory Visit: Payer: Self-pay | Attending: Internal Medicine | Admitting: Internal Medicine

## 2015-03-21 ENCOUNTER — Encounter: Payer: Self-pay | Admitting: Internal Medicine

## 2015-03-21 VITALS — BP 137/69 | HR 76 | Temp 98.8°F | Resp 18 | Ht 66.0 in | Wt 191.0 lb

## 2015-03-21 DIAGNOSIS — N400 Enlarged prostate without lower urinary tract symptoms: Secondary | ICD-10-CM | POA: Insufficient documentation

## 2015-03-21 DIAGNOSIS — R7303 Prediabetes: Secondary | ICD-10-CM | POA: Insufficient documentation

## 2015-03-21 DIAGNOSIS — N529 Male erectile dysfunction, unspecified: Secondary | ICD-10-CM | POA: Insufficient documentation

## 2015-03-21 DIAGNOSIS — Z7984 Long term (current) use of oral hypoglycemic drugs: Secondary | ICD-10-CM | POA: Insufficient documentation

## 2015-03-21 DIAGNOSIS — E785 Hyperlipidemia, unspecified: Secondary | ICD-10-CM | POA: Insufficient documentation

## 2015-03-21 DIAGNOSIS — Z79899 Other long term (current) drug therapy: Secondary | ICD-10-CM | POA: Insufficient documentation

## 2015-03-21 DIAGNOSIS — E119 Type 2 diabetes mellitus without complications: Secondary | ICD-10-CM | POA: Insufficient documentation

## 2015-03-21 DIAGNOSIS — F528 Other sexual dysfunction not due to a substance or known physiological condition: Secondary | ICD-10-CM | POA: Insufficient documentation

## 2015-03-21 LAB — POCT GLYCOSYLATED HEMOGLOBIN (HGB A1C): Hemoglobin A1C: 6.9

## 2015-03-21 MED ORDER — DOXAZOSIN MESYLATE 2 MG PO TABS: 2.0000 mg | ORAL_TABLET | Freq: Every day | ORAL | Status: DC

## 2015-03-21 MED ORDER — ROSUVASTATIN CALCIUM 20 MG PO TABS: 20.0000 mg | ORAL_TABLET | Freq: Every day | ORAL | Status: DC

## 2015-03-21 MED ORDER — SILDENAFIL CITRATE 100 MG PO TABS: 100.0000 mg | ORAL_TABLET | Freq: Every day | ORAL | Status: DC | PRN

## 2015-03-21 MED ORDER — METFORMIN HCL 500 MG PO TABS: 500.0000 mg | ORAL_TABLET | Freq: Two times a day (BID) | ORAL | Status: DC

## 2015-03-21 NOTE — Progress Notes (Signed)
Patient here for Prostate FU.  Patient denies pain with his prostate though he would like a check. Patient complains of polyuria.  Patient complains of pain in left arm which radiates to fingers causing a numbing sensation in fingers. Symptom occurs at night.

## 2015-03-21 NOTE — Patient Instructions (Addendum)
Hiperplasia prosttica benigna (Benign Prostatic Hyperplasia) Neomia Dear prstata agrandada (hiperplasia prosttica benigna) es frecuente en los hombres [place]. Puede experimentar los siguientes sntomas:  Chorro de orina dbil.  Goteo.  Sensacin de que la vejiga no se ha vaciado completamente.  Dificultad para comenzar a Geographical information systems officer.  Levantarse con frecuencia por la noche para orinar.  Orinar con ms frecuencia Administrator. INSTRUCCIONES PARA EL CUIDADO EN EL HOGAR  Controle su hiperplasia prosttica para ver si hay cambios. Las siguientes acciones ayudarn a Paramedic cualquier [facility] pueda experimentar:  Tmese tiempo para Geographical information systems officer.  No beba alcohol.  Evite las bebidas que contengan cafena, como [place], el t, las bebidas cola ya que pueden empeorar el problema.  Evite los descongestivos, antihistamnicos y algunos medicamentos recetados que pueden empeorar el problema.  Haga controles con su mdico para seguir el tratamiento, segn las indicaciones. SOLICITE ATENCIN MDICA SI:  Experimenta dificultad progresiva para el vaciamiento.  El chorro de [place] es progresivamente ms pequeo.  Se despierta por la noche con necesidad de orinar con ms frecuencia.  Siente constantemente la necesidad de vaciar la vejiga.  Experimenta prdida de orina, especialmente en pequeas cantidades. SOLICITE ATENCIN MDICA DE INMEDIATO SI:   Siente un dolor que va aumentando al orinar o no puede Geographical information systems officer.  Siente dolor abdominal intenso, vomita, tiene fiebre o se desmaya.  Siente dolor de espalda o tiene sangre en la [place]. ASEGRESE DE QUE:   Comprende estas instrucciones.  Controlar su afeccin.  Recibir ayuda de inmediato si no mejora o si empeora.   Esta informacin no tiene Theme park manager el consejo del mdico. Asegrese de hacerle al mdico cualquier pregunta que tenga.   Document Released: 03/30/2005 Document Revised: 04/20/2014 Elsevier Interactive Patient Education  2016 [employer]. Basic Carbohydrate Counting for Diabetes Mellitus Carbohydrate counting is a method for keeping track of the amount of carbohydrates you eat. Eating carbohydrates naturally increases the level of sugar (glucose) in your blood, so it is important for you to know the amount that is okay for you to have in every meal. Carbohydrate counting helps keep the level of glucose in your blood within normal limits. The amount of carbohydrates allowed is different for every person. A dietitian can help you calculate the amount that is right for you. Once you know the amount of carbohydrates you can have, you can count the carbohydrates in the foods you want to eat. Carbohydrates are found in the following foods:  Grains, such as breads and cereals.  Dried beans and soy products.  Starchy vegetables, such as potatoes, peas, and corn.  Fruit and fruit juices.  Milk and yogurt.  Sweets and snack foods, such as cake, cookies, candy, chips, soft drinks, and fruit drinks. CARBOHYDRATE COUNTING There are two ways to count the carbohydrates in your food. You can use either of the methods or a combination of both. Reading the "Nutrition Facts" on Packaged Food The "Nutrition Facts" is an area that is included on the labels of almost all packaged food and beverages in the [place]. It includes the serving size of that food or beverage and information about the nutrients in each serving of the food, including the grams (g) of carbohydrate per serving.  Decide the number of servings of this food or beverage that you will be able to eat or drink. Multiply that number of servings by the number of grams of carbohydrate that is listed on the label for that serving. The total will be the amount  of carbohydrates you will be having when you eat or drink this food or beverage. Learning Standard Serving Sizes of Food When you eat food that is not packaged or does not include "Nutrition Facts" on the  label, you need to measure the servings in order to count the amount of carbohydrates.A serving of most carbohydrate-rich foods contains about 15 g of carbohydrates. The following list includes serving sizes of carbohydrate-rich foods that provide 15 g ofcarbohydrate per serving:   1 slice of bread (1 oz) or 1 six-inch tortilla.    of a hamburger bun or English muffin.  4-6 crackers.   cup unsweetened dry cereal.    cup hot cereal.   cup rice or pasta.    cup mashed potatoes or  of a large baked potato.  1 cup fresh fruit or one small piece of fruit.    cup canned or frozen fruit or fruit juice.  1 cup milk.   cup plain fat-free yogurt or yogurt sweetened with artificial sweeteners.   cup cooked dried beans or starchy vegetable, such as peas, corn, or potatoes.  Decide the number of standard-size servings that you will eat. Multiply that number of servings by 15 (the grams of carbohydrates in that serving). For example, if you eat 2 cups of strawberries, you will have eaten 2 servings and 30 g of carbohydrates (2 servings x 15 g = 30 g). For foods such as soups and casseroles, in which more than one food is mixed in, you will need to count the carbohydrates in each food that is included. EXAMPLE OF CARBOHYDRATE COUNTING Sample Dinner  3 oz chicken breast.   cup of brown rice.   cup of corn.  1 cup milk.   1 cup strawberries with sugar-free whipped topping.  Carbohydrate Calculation Step 1: Identify the foods that contain carbohydrates:   Rice.   Corn.   Milk.   Strawberries. Step 2:Calculate the number of servings eaten of each:   2 servings of rice.   1 serving of corn.   1 serving of milk.   1 serving of strawberries. Step 3: Multiply each of those number of servings by 15 g:   2 servings of rice x 15 g = 30 g.   1 serving of corn x 15 g = 15 g.   1 serving of milk x 15 g = 15 g.   1 serving of strawberries x 15 g = 15  g. Step 4: Add together all of the amounts to find the total grams of carbohydrates eaten: 30 g + 15 g + 15 g + 15 g = 75 g.   This information is not intended to replace advice given to you by your health care provider. Make sure you discuss any questions you have with your health care provider.   Document Released: 03/30/2005 Document Revised: 04/20/2014 Document Reviewed: 02/24/2013 Elsevier Interactive Patient Education 2016 [employer] Diabetes and Exercise Exercising regularly is important. It is not just about losing weight. It has many health benefits, such as:  Improving your overall fitness, flexibility, and endurance.  Increasing your bone density.  Helping with weight control.  Decreasing your body fat.  Increasing your muscle strength.  Reducing stress and tension.  Improving your overall health. People with diabetes who exercise gain additional benefits because exercise:  Reduces appetite.  Improves the body's use of blood sugar (glucose).  Helps lower or control blood glucose.  Decreases blood pressure.  Helps control blood lipids (such  as cholesterol and triglycerides).  Improves the body's use of the hormone insulin by:  Increasing the body's insulin sensitivity.  Reducing the body's insulin needs.  Decreases the risk for heart disease because exercising:  Lowers cholesterol and triglycerides levels.  Increases the levels of good cholesterol (such as high-density lipoproteins [HDL]) in the body.  Lowers blood glucose levels. YOUR ACTIVITY PLAN  Choose an activity that you enjoy, and set realistic goals. To exercise safely, you should begin practicing any new physical activity slowly, and gradually increase the intensity of the exercise over time. Your health care provider or diabetes educator can help create an activity plan that works for you. General recommendations include:  Encouraging children to engage in at least 60 minutes of physical  activity each day.  Stretching and performing strength training exercises, such as yoga or weight lifting, at least 2 times per week.  Performing a total of at least 150 minutes of moderate-intensity exercise each week, such as brisk walking or water aerobics.  Exercising at least 3 days per week, making sure you allow no more than 2 consecutive days to pass without exercising.  Avoiding long periods of inactivity (90 minutes or more). When you have to spend an extended period of time sitting down, take frequent breaks to walk or stretch. RECOMMENDATIONS FOR EXERCISING WITH TYPE 1 OR TYPE 2 DIABETES   Check your blood glucose before exercising. If blood glucose levels are greater than 240 mg/dL, check for urine ketones. Do not exercise if ketones are present.  Avoid injecting insulin into areas of the body that are going to be exercised. For example, avoid injecting insulin into:  The arms when playing tennis.  The legs when jogging.  Keep a record of:  Food intake before and after you exercise.  Expected peak times of insulin action.  Blood glucose levels before and after you exercise.  The type and amount of exercise you have done.  Review your records with your health care provider. Your health care provider will help you to develop guidelines for adjusting food intake and insulin amounts before and after exercising.  If you take insulin or oral hypoglycemic agents, watch for signs and symptoms of hypoglycemia. They include:  Dizziness.  Shaking.  Sweating.  Chills.  Confusion.  Drink plenty of water while you exercise to prevent dehydration or heat stroke. Body water is lost during exercise and must be replaced.  Talk to your health care provider before starting an exercise program to make sure it is safe for you. Remember, almost any type of activity is better than none.   This information is not intended to replace advice given to you by your health care provider.  Make sure you discuss any questions you have with your health care provider.   Document Released: 06/20/2003 Document Revised: 08/14/2014 Document Reviewed: 09/06/2012 Elsevier Interactive Patient Education [employer]

## 2015-03-21 NOTE — Progress Notes (Signed)
Patient ID: Connor Murray, male   DOB: 03/01/1951, 64 y.o.   MRN: [id number]   Connor Murray, is a 64 y.o. male  [id number]  [id number]  DOB - 07/28/1950  Chief Complaint  Patient presents with  . Follow-up        Subjective:   Connor Murray is a 64 y.o. male with history of dyslipidemia, prediabetes, benign prostatic hypertrophy and erectile dysfunction here today for a follow up visit. Patient has no significant complaint today. He is requesting medication refills. He was seen here recently for a wart growth on his forehead for which he had cryotherapy but has since grown back. Patient has occasional pain in his left arm with associated numbness and tingling of the fingers mostly after waking up from sleep. Patient thinks this may be related to sleeping posture. Patient does not smoke cigarette, he does not drink alcohol. Patient has No headache, No chest pain, No abdominal pain - No Nausea, No new weakness tingling or numbness, No Cough - SOB.  Problem  Prediabetes    ALLERGIES: No Known Allergies  PAST MEDICAL HISTORY: History reviewed. No pertinent past medical history.  MEDICATIONS AT HOME: Prior to Admission medications   Medication Sig Start Date End Date Taking? Authorizing Provider  doxazosin (CARDURA) 2 MG tablet Take 1 tablet (2 mg total) by mouth daily. 03/21/15  Yes Quentin Angstlugbemiga E Chiniqua Kilcrease, MD  rosuvastatin (CRESTOR) 20 MG tablet Take 1 tablet (20 mg total) by mouth daily. 03/21/15  Yes Quentin Angstlugbemiga E Terrell Ostrand, MD  sildenafil (VIAGRA) 100 MG tablet Take 1 tablet (100 mg total) by mouth daily as needed for erectile dysfunction. 03/21/15  Yes Quentin Angstlugbemiga E Melecio Cueto, MD  metFORMIN (GLUCOPHAGE) 500 MG tablet Take 1 tablet (500 mg total) by mouth 2 (two) times daily with a meal. 03/21/15   Quentin Angstlugbemiga E Ellinore Merced, MD     Objective:   Filed Vitals:   03/21/15 1626  BP: 137/69  Pulse: 76  Temp: 98.8 F (37.1 C)  TempSrc: Oral  Resp: 18  Height: 5\' 6"  (1.676 m)  Weight: 191  lb (86.637 kg)  SpO2: 97%    Exam General appearance : Awake, alert, not in any distress. Speech Clear. Not toxic looking HEENT: Atraumatic and Normocephalic, pupils equally reactive to light and accomodation Neck: supple, no JVD. No cervical lymphadenopathy.  Chest:Good air entry bilaterally, no added sounds  CVS: S1 S2 regular, no murmurs.  Abdomen: Bowel sounds present, Non tender and not distended with no gaurding, rigidity or rebound. Extremities: B/L Lower Ext shows no edema, both legs are warm to touch Neurology: Awake alert, and oriented X 3, CN II-XII intact, Non focal  Data Review Lab Results  Component Value Date   HGBA1C 6.90 03/21/2015   HGBA1C 6.50 08/20/2014   HGBA1C 5.8 03/02/2013     Assessment & Plan   1. New onset type 2 diabetes mellitus (HCC)  - POCT A1C is 6.9%today, patient diagnosed with Type 2 DM - metFORMIN (GLUCOPHAGE) 500 MG tablet; Take 1 tablet (500 mg total) by mouth 2 (two) times daily with a meal.  Dispense: 180 tablet; Refill: 3  2. BPH (benign prostatic hyperplasia)  - doxazosin (CARDURA) 2 MG tablet; Take 1 tablet (2 mg total) by mouth daily.  Dispense: 30 tablet; Refill: 3  3. Dyslipidemia  - rosuvastatin (CRESTOR) 20 MG tablet; Take 1 tablet (20 mg total) by mouth daily.  Dispense: 90 tablet; Refill: 3  4. ERECTILE DYSFUNCTION  - sildenafil (VIAGRA) 100 MG tablet; Take 1 tablet (  100 mg total) by mouth daily as needed for erectile dysfunction.  Dispense: 30 tablet; Refill: 3  Patient have been counseled extensively about nutrition and exercise  Return in about 6 months (around 09/19/2015) for Follow up Pain and comorbidities, Routine Follow Up.  The patient was given clear instructions to go to ER or return to medical center if symptoms don't improve, worsen or new problems develop. The patient verbalized understanding. The patient was told to call to get lab results if they haven't heard anything in the next week.   This note has  been created with Education officer, environmental. Any transcriptional errors are unintentional.    Jeanann Lewandowsky, MD, MHA, FACP, FAAP, CPE [facility] [place], [place] [phone]   03/21/2015, 5:29 PM

## 2015-03-25 ENCOUNTER — Encounter: Payer: Self-pay | Admitting: Internal Medicine

## 2015-03-25 ENCOUNTER — Ambulatory Visit: Payer: Self-pay | Attending: Internal Medicine | Admitting: Internal Medicine

## 2015-03-25 VITALS — BP 133/65 | HR 80 | Temp 99.1°F | Resp 16 | Ht 66.0 in | Wt 190.0 lb

## 2015-03-25 DIAGNOSIS — Z79899 Other long term (current) drug therapy: Secondary | ICD-10-CM | POA: Insufficient documentation

## 2015-03-25 DIAGNOSIS — B079 Viral wart, unspecified: Secondary | ICD-10-CM

## 2015-03-25 DIAGNOSIS — Z7984 Long term (current) use of oral hypoglycemic drugs: Secondary | ICD-10-CM | POA: Insufficient documentation

## 2015-03-25 DIAGNOSIS — E785 Hyperlipidemia, unspecified: Secondary | ICD-10-CM

## 2015-03-25 NOTE — Progress Notes (Signed)
F/U wart on forehead  No pain today No tobacco user  No suicidal thought in the past two weeks

## 2015-03-25 NOTE — Progress Notes (Signed)
Patient ID: Connor Murray, male   DOB: 12/28/1950, 64 y.o.   MRN: [id number]   Connor Murray, is a 64 y.o. male  [id number]  [id number]  DOB - 11/25/1950  Chief Complaint  Patient presents with  . Follow-up        Subjective:   Connor Murray is a 64 y.o. male here today for a follow up visit. Patient was recently seen here. He was scheduled today for cryotherapy of wart on his forehead and the left postauricular area. Patient has No headache, No chest pain, No abdominal pain - No Nausea, No new weakness tingling or numbness, No Cough - SOB.  Problem  Viral Wart    ALLERGIES: No Known Allergies  PAST MEDICAL HISTORY: History reviewed. No pertinent past medical history.  MEDICATIONS AT HOME: Prior to Admission medications   Medication Sig Start Date End Date Taking? Authorizing Provider  doxazosin (CARDURA) 2 MG tablet Take 1 tablet (2 mg total) by mouth daily. 03/21/15  Yes Quentin Angstlugbemiga E Rondy Krupinski, MD  metFORMIN (GLUCOPHAGE) 500 MG tablet Take 1 tablet (500 mg total) by mouth 2 (two) times daily with a meal. 03/21/15  Yes Quentin Angstlugbemiga E Rozalyn Osland, MD  rosuvastatin (CRESTOR) 20 MG tablet Take 1 tablet (20 mg total) by mouth daily. 03/21/15  Yes Quentin Angstlugbemiga E Jennalee Greaves, MD  sildenafil (VIAGRA) 100 MG tablet Take 1 tablet (100 mg total) by mouth daily as needed for erectile dysfunction. 03/21/15  Yes Quentin Angstlugbemiga E Latiqua Daloia, MD     Objective:   Filed Vitals:   03/25/15 1129  BP: 133/65  Pulse: 80  Temp: 99.1 F (37.3 C)  TempSrc: Oral  Resp: 16  Height: 5\' 6"  (1.676 m)  Weight: 190 lb (86.183 kg)  SpO2: 96%    Exam General appearance : Awake, alert, not in any distress. Speech Clear. Not toxic looking HEENT: Atraumatic and Normocephalic, pupils equally reactive to light and accomodation Neck: supple, no JVD. No cervical lymphadenopathy.  Chest:Good air entry bilaterally, no added sounds  CVS: S1 S2 regular, no murmurs.  Abdomen: Bowel sounds present, Non tender and not  distended with no gaurding, rigidity or rebound. Extremities: B/L Lower Ext shows no edema, both legs are warm to touch Neurology: Awake alert, and oriented X 3, CN II-XII intact, Non focal Skin: Wart on forehead and left post-auricular area  Data Review Lab Results  Component Value Date   HGBA1C 6.90 03/21/2015   HGBA1C 6.50 08/20/2014   HGBA1C 5.8 03/02/2013     Assessment & Plan   1. Dyslipidemia  To address this please limit saturated fat to no more than 7% of your calories, limit cholesterol to 200 mg/day, increase fiber and exercise as tolerated. If needed we may add another cholesterol lowering medication to your regimen.   2. Viral wart on the forehead  Cryotherapy applied to the sites on forehead and posterior auricular area on the left.  Patient have been counseled extensively about nutrition and exercise  Return if symptoms worsen or fail to improve, for Follow up HTN.  The patient was given clear instructions to go to ER or return to medical center if symptoms don't improve, worsen or new problems develop. The patient verbalized understanding. The patient was told to call to get lab results if they haven't heard anything in the next week.   This note has been created with Education officer, environmentalDragon speech recognition software and smart phrase technology. Any transcriptional errors are unintentional.    Seba Madole, MD, MHA, FACP, FAAP, CPE [facility] [place], [place] [phone]   03/25/2015, 11:55 AM

## 2015-03-25 NOTE — Patient Instructions (Signed)
Warts Warts are small growths on the skin. They are common and can occur on various areas of the body. A person may have one wart or multiple warts. Most warts are not painful, and they usually do not cause problems. However, warts can cause pain if they are large or occur in an area of the body where pressure will be applied to them, such as the bottom of the foot. In many cases, warts do not require treatment. They usually go away on their own over a period of many months to a couple years. Various treatments may be done for warts that cause problems or do not go away. Sometimes, warts go away and then come back again. CAUSES Warts are caused by a type of virus that is called human papillomavirus (HPV). This virus can spread from person to person through direct contact. Warts can also spread to other areas of the body when a person scratches a wart and then scratches another area of his or her body.  RISK FACTORS Warts are more likely to develop in:  People who are 10-20 years of age.  People who have a weakened body defense system (immune system). SYMPTOMS A wart may be round or oval or have an irregular shape. Most warts have a rough surface. Warts may range in color from skin color to light yellow, brown, or gray. They are generally less than  inch (1.3 cm) in size. Most warts are painless, but some can be painful when pressure is applied to them. DIAGNOSIS A wart can usually be diagnosed from its appearance. In some cases, a tissue sample may be removed (biopsy) to be looked at under a microscope. TREATMENT In many cases, warts do not need treatment. If treatment is needed, options may include:  Applying medicated solutions, creams, or patches to the wart. These may be over-the-counter or prescription medicines that make the skin soft so that layers will gradually shed away. In many cases, the medicine is applied one or two times per day and covered with a bandage.  Putting duct tape over  the top of the wart (occlusion). You will leave the tape in place for as long as told by your health care provider, then you will replace it with a new strip of tape. This is done until the wart goes away.  Freezing the wart with liquid nitrogen (cryotherapy).  Burning the wart with:  Laser treatment.  An electrified probe (electrocautery).  Injection of a medicine (Candida antigen) into the wart to help the body's immune system to fight off the wart.  Surgery to remove the wart. HOME CARE INSTRUCTIONS  Apply over-the-counter and prescription medicines only as told by your health care provider.  Do not apply over-the-counter wart medicines to your face or genitals before you ask your health care provider if it is okay to do so.  Do not scratch or pick at a wart.  Wash your hands after you touch a wart.  Avoid shaving hair that is over a wart.  Keep all follow-up visits as told by your health care provider. This is important. SEEK MEDICAL CARE IF:  Your warts do not improve after treatment.  You have redness, swelling, or pain at the site of a wart.  You have bleeding from a wart that does not stop with light pressure.  You have diabetes and you develop a wart.   This information is not intended to replace advice given to you by your health care provider. Make sure   you discuss any questions you have with your health care provider.   Document Released: 01/07/2005 Document Revised: 12/19/2014 Document Reviewed: 06/25/2014 Elsevier Interactive Patient Education 2016 Elsevier Inc.  

## 2015-04-19 MED FILL — !VIAGRA 100MG TABLET: 100 | 30 days supply | Qty: 10 | Fill #1

## 2015-04-23 MED FILL — ?DOXAZOSIN MESYLATE 2 MG TA: 2 MG | 30 days supply | Qty: 30 | Fill #1

## 2015-04-25 MED FILL — ?METFORMIN HCL 500MG TABLET: 500 | 30 days supply | Qty: 60 | Fill #1

## 2015-05-14 MED FILL — ROSUVASTATIN CAL 20 MG TAB: 20 | 30 days supply | Qty: 30 | Fill #1

## 2015-05-14 MED FILL — $VIAGRA 100 MG TABLET: 100 | 30 days supply | Qty: 5 | Fill #4

## 2015-05-23 ENCOUNTER — Encounter: Payer: Self-pay | Admitting: Internal Medicine

## 2015-05-23 ENCOUNTER — Ambulatory Visit: Payer: BLUE CROSS/BLUE SHIELD | Attending: Internal Medicine | Admitting: Internal Medicine

## 2015-05-23 VITALS — BP 145/73 | HR 59 | Temp 98.2°F | Resp 18 | Ht 66.0 in | Wt 184.0 lb

## 2015-05-23 DIAGNOSIS — E119 Type 2 diabetes mellitus without complications: Secondary | ICD-10-CM | POA: Insufficient documentation

## 2015-05-23 DIAGNOSIS — R202 Paresthesia of skin: Secondary | ICD-10-CM | POA: Diagnosis not present

## 2015-05-23 DIAGNOSIS — R200 Anesthesia of skin: Secondary | ICD-10-CM | POA: Insufficient documentation

## 2015-05-23 DIAGNOSIS — Z79899 Other long term (current) drug therapy: Secondary | ICD-10-CM | POA: Diagnosis not present

## 2015-05-23 DIAGNOSIS — K029 Dental caries, unspecified: Secondary | ICD-10-CM

## 2015-05-23 DIAGNOSIS — N400 Enlarged prostate without lower urinary tract symptoms: Secondary | ICD-10-CM | POA: Diagnosis not present

## 2015-05-23 DIAGNOSIS — Z7984 Long term (current) use of oral hypoglycemic drugs: Secondary | ICD-10-CM | POA: Insufficient documentation

## 2015-05-23 DIAGNOSIS — E785 Hyperlipidemia, unspecified: Secondary | ICD-10-CM | POA: Diagnosis not present

## 2015-05-23 MED ORDER — ACETAMINOPHEN-CODEINE #3 300-30 MG PO TABS: 1.0000 | ORAL_TABLET | ORAL | Status: DC | PRN

## 2015-05-23 MED ORDER — DICLOFENAC SODIUM 1 % TD GEL: 4.0000 g | Freq: Four times a day (QID) | TRANSDERMAL | Status: DC

## 2015-05-23 MED FILL — ?DOXAZOSIN MESYLATE 2 MG TA: 2 MG | 30 days supply | Qty: 30 | Fill #2

## 2015-05-23 MED FILL — metFORMIN HCL 500 MG TABS: 500 | 30 days supply | Qty: 60 | Fill #2

## 2015-05-23 MED FILL — VOLTAREN 1% GEL: 1 | 25 days supply | Qty: 200 | Fill #0

## 2015-05-23 MED FILL — ACETAMINOPHEN/COD #3 TABLET: 300-30 | 10 days supply | Qty: 60 | Fill #0

## 2015-05-23 NOTE — Progress Notes (Signed)
Connor Murray, is a 65 y.o. male  WUJ:[id number]  NFA:[id number]  DOB - [birth year]  CC:  Chief Complaint  Patient presents with  . Numbness    left hand   HPI: Connor Murray is a 65 y.o. male here today for a follow up visit. Patient has history of BPH, Dyslipidemia, DM T2 (05/23/2015), and erectile dysfunction. Patient presents with recurrent left shoulder pain which radiates to left hand and causes numbness and tingling in the fingers of his left hand. Patient also has complaints of new left lower leg and knee pain. The pain is rated as 5 of 10 and is sharp, patient has had moderate relief with otc tylenol. Patient also seeking referal to Dental clinic for dental carries and cleaning. Patient has No headache, No chest pain, No abdominal pain - No Nausea, No Cough - SOB.  No Known Allergies History reviewed. No pertinent past medical history. Current Outpatient Prescriptions on File Prior to Visit  Medication Sig Dispense Refill  . doxazosin (CARDURA) 2 MG tablet Take 1 tablet (2 mg total) by mouth daily. 30 tablet 3  . metFORMIN (GLUCOPHAGE) 500 MG tablet Take 1 tablet (500 mg total) by mouth 2 (two) times daily with a meal. 180 tablet 3  . rosuvastatin (CRESTOR) 20 MG tablet Take 1 tablet (20 mg total) by mouth daily. 90 tablet 3  . sildenafil (VIAGRA) 100 MG tablet Take 1 tablet (100 mg total) by mouth daily as needed for erectile dysfunction. 30 tablet 3   No current facility-administered medications on file prior to visit.   History reviewed. No pertinent family history. Social History   Social History  . Marital Status: Divorced    Spouse Name: N/A  . Number of Children: N/A  . Years of Education: N/A   Occupational History  . cab driver    Social History Main Topics  . Smoking status: Never Smoker   . Smokeless tobacco: Not on file  . Alcohol Use: No  . Drug Use: No  . Sexual Activity: Yes   Other Topics Concern  . Not on file   Social History Narrative     Review of Systems: Constitutional: Negative for fever, chills, diaphoresis, activity change, appetite change and fatigue. HENT: Negative for ear pain, nosebleeds, congestion, facial swelling, rhinorrhea, neck pain, neck stiffness and ear discharge.  Eyes: Negative for pain, discharge, redness, itching and visual disturbance. Respiratory: Negative for cough, choking, chest tightness, shortness of breath, wheezing and stridor.  Cardiovascular: Negative for chest pain, palpitations and leg swelling. Gastrointestinal: Negative for abdominal distention. Genitourinary: Negative for dysuria, urgency, frequency, hematuria, flank pain, decreased urine volume, difficulty urinating and dyspareunia.  Musculoskeletal: Negative for back pain, joint swelling, arthralgia and gait problem. Neurological: Negative for dizziness, tremors, seizures, syncope, facial asymmetry, speech difficulty, weakness, light-headedness, numbness and headaches.  Hematological: Negative for adenopathy. Does not bruise/bleed easily. Psychiatric/Behavioral: Negative for hallucinations, behavioral problems, confusion, dysphoric mood, decreased concentration and agitation.    Objective:   Filed Vitals:   05/23/15 1256  BP: 145/73  Pulse: 59  Temp: 98.2 F (36.8 C)  Resp: 18    Physical Exam: Constitutional: Patient appears well-developed and well-nourished. No distress. Neck: Normal ROM. Neck supple. No JVD. No tracheal deviation. No thyromegaly. CVS: RRR, S1/S2 +, no murmurs, no gallops, no carotid bruit.  Pulmonary: Effort and breath sounds normal, no stridor, rhonchi, wheezes, rales.  Abdominal: Soft. BS +, no distension, tenderness, rebound or guarding.  Musculoskeletal: Normal range of motion. No edema and  no tenderness. Positive for pain in left shoulder and left leg and knee or range of motion` Lymphadenopathy: No lymphadenopathy noted. Neuro: Alert and Oriented x 4 Psychiatric: Normal mood and affect.  Behavior, judgment, thought content normal.  Lab Results  Component Value Date   WBC 7.4 09/08/2012   HGB 15.3 09/08/2012   HCT 43.8 09/08/2012   MCV 89.0 09/08/2012   PLT 229 09/08/2012   Lab Results  Component Value Date   CREATININE 0.94 09/06/2014   BUN 13 09/06/2014   NA 140 09/06/2014   K 5.1 09/06/2014   CL 104 09/06/2014   CO2 28 09/06/2014    Lab Results  Component Value Date   HGBA1C 6.90 03/21/2015   Lipid Panel     Component Value Date/Time   CHOL 196 08/20/2014 1506   TRIG 287* 08/20/2014 1506   HDL 49 08/20/2014 1506   CHOLHDL 4.0 08/20/2014 1506   VLDL 57* 08/20/2014 1506   LDLCALC 90 08/20/2014 1506       Assessment and plan:   Burr was seen today for numbness.  Diagnoses and all orders for this visit:  1. Left hand paresthesia: Most likely from cervical spine -     MR Cervical Spine Wo Contrast; Future -     acetaminophen-codeine (TYLENOL #3) 300-30 MG tablet; Take 1 tablet by mouth every 4 (four) hours as needed. -     diclofenac sodium (VOLTAREN) 1 % GEL; Apply 4 g topically 4 (four) times daily.  2. Dyslipidemia  To address this please limit saturated fat to no more than 7% of your calories, limit cholesterol to 200 mg/day, increase fiber and exercise as tolerated. If needed we may add another cholesterol lowering medication to your regimen.   3. ype 2 diabetes mellitus without complication, without long-term current use of insulin (HCC) Refill      -   Metformin 500 mg tablet  bid  Aim for 30 minutes of exercise most days. Rethink what you drink.  Water is great! Aim for 2-3 Carb Choices per meal (30-45 grams) +/- 1 either way   Aim for 0-15 Carbs per snack if hungry   Include protein in moderation with your meals and snacks   Consider reading food labels for Total Carbohydrate and Fat Grams of foods   Consider checking BG at alternate times per day   Continue taking medication as directed Be mindful about how much sugar you are  adding to beverages and other foods.  Fruit Punch - find one with no sugar   Measure and decrease portions of carbohydrate foods   Make your plate and don't go back for seconds  4. Dental caries  -     Ambulatory referral to Dentistry  Return in about 3 months (around 08/20/2015), or if symptoms worsen or fail to improve, for Follow up Pain and comorbidities, Hemoglobin A1C and Follow up, DM.  The patient was given clear instructions to go to ER or return to medical center if symptoms don't improve, worsen or new problems develop. The patient verbalized understanding.   Stephanie Coup, AGNP-Student [facility] [phone] 05/23/2015, 6:37 PM   Evaluation and management procedures were performed by the Advanced Practitioner under my supervision and collaboration. I have reviewed the Advanced Practitioner's note and chart, and I agree with the management and plan.   Jeanann Lewandowsky, MD, MHA, CPE, FACP, FAAP [facility] [place], [place] [phone]   05/27/2015, 6:41 PM

## 2015-05-23 NOTE — Progress Notes (Signed)
Patient is here for numbness  Patient complains of left hand pain being present scaled currently at an 8.

## 2015-05-23 NOTE — Patient Instructions (Addendum)
Osteoarthritis Osteoarthritis is a disease that causes soreness and inflammation of a joint. It occurs when the cartilage at the affected joint wears down. Cartilage acts as a cushion, covering the ends of bones where they meet to form a joint. Osteoarthritis is the most common form of arthritis. It often occurs in older people. The joints affected most often by this condition include those in the:  Ends of the fingers.  Thumbs.  Neck.  Lower back.  Knees.  Hips. CAUSES  Over time, the cartilage that covers the ends of bones begins to wear away. This causes bone to rub on bone, producing pain and stiffness in the affected joints.  RISK FACTORS Certain factors can increase your chances of having osteoarthritis, including:  Older age.  Excessive body weight.  Overuse of joints.  Previous joint injury. SIGNS AND SYMPTOMS   Pain, swelling, and stiffness in the joint.  Over time, the joint may lose its normal shape.  Small deposits of bone (osteophytes) may grow on the edges of the joint.  Bits of bone or cartilage can break off and float inside the joint space. This may cause more pain and damage. DIAGNOSIS  Your health care provider will do a physical exam and ask about your symptoms. Various tests may be ordered, such as:  X-rays of the affected joint.  Blood tests to rule out other types of arthritis. Additional tests may be used to diagnose your condition. TREATMENT  Goals of treatment are to control pain and improve joint function. Treatment plans may include:  A prescribed exercise program that allows for rest and joint relief.  A weight control plan.  Pain relief techniques, such as:  Properly applied heat and cold.  Electric pulses delivered to nerve endings under the skin (transcutaneous electrical nerve stimulation [TENS]).  Massage.  Certain nutritional supplements.  Medicines to control pain, such as:  Acetaminophen.  Nonsteroidal  anti-inflammatory drugs (NSAIDs), such as naproxen.  Narcotic or central-acting agents, such as tramadol.  Corticosteroids. These can be given orally or as an injection.  Surgery to reposition the bones and relieve pain (osteotomy) or to remove loose pieces of bone and cartilage. Joint replacement may be needed in advanced states of osteoarthritis. HOME CARE INSTRUCTIONS   Take medicines only as directed by your health care provider.  Maintain a healthy weight. Follow your health care provider's instructions for weight control. This may include dietary instructions.  Exercise as directed. Your health care provider can recommend specific types of exercise. These may include:  Strengthening exercises. These are done to strengthen the muscles that support joints affected by arthritis. They can be performed with weights or with exercise bands to add resistance.  Aerobic activities. These are exercises, such as brisk walking or low-impact aerobics, that get your heart pumping.  Range-of-motion activities. These keep your joints limber.  Balance and agility exercises. These help you maintain daily living skills.  Rest your affected joints as directed by your health care provider.  Keep all follow-up visits as directed by your health care provider. SEEK MEDICAL CARE IF:   Your skin turns red.  You develop a rash in addition to your joint pain.  You have worsening joint pain.  You have a fever along with joint or muscle aches. SEEK IMMEDIATE MEDICAL CARE IF:  You have a significant loss of weight or appetite.  You have night sweats. FOR MORE INFORMATION   National Institute of Arthritis and Musculoskeletal and Skin Diseases: www.niams.http://www.myers.net/  National Institute on  Aging: https://walker.com/  American College of Rheumatology: www.rheumatology.org   This information is not intended to replace advice given to you by your health care provider. Make sure you discuss any questions you  have with your health care provider.   Document Released: 03/30/2005 Document Revised: 04/20/2014 Document Reviewed: 12/05/2012 Elsevier Interactive Patient Education 2016 [employer]. Diabetes and Exercise Exercising regularly is important. It is not just about losing weight. It has many health benefits, such as:  Improving your overall fitness, flexibility, and endurance.  Increasing your bone density.  Helping with weight control.  Decreasing your body fat.  Increasing your muscle strength.  Reducing stress and tension.  Improving your overall health. People with diabetes who exercise gain additional benefits because exercise:  Reduces appetite.  Improves the body's use of blood sugar (glucose).  Helps lower or control blood glucose.  Decreases blood pressure.  Helps control blood lipids (such as cholesterol and triglycerides).  Improves the body's use of the hormone insulin by:  Increasing the body's insulin sensitivity.  Reducing the body's insulin needs.  Decreases the risk for heart disease because exercising:  Lowers cholesterol and triglycerides levels.  Increases the levels of good cholesterol (such as high-density lipoproteins [HDL]) in the body.  Lowers blood glucose levels. YOUR ACTIVITY PLAN  Choose an activity that you enjoy, and set realistic goals. To exercise safely, you should begin practicing any new physical activity slowly, and gradually increase the intensity of the exercise over time. Your health care provider or diabetes educator can help create an activity plan that works for you. General recommendations include:  Encouraging children to engage in at least 60 minutes of physical activity each day.  Stretching and performing strength training exercises, such as yoga or weight lifting, at least 2 times per week.  Performing a total of at least 150 minutes of moderate-intensity exercise each week, such as brisk walking or water  aerobics.  Exercising at least 3 days per week, making sure you allow no more than 2 consecutive days to pass without exercising.  Avoiding long periods of inactivity (90 minutes or more). When you have to spend an extended period of time sitting down, take frequent breaks to walk or stretch. RECOMMENDATIONS FOR EXERCISING WITH TYPE 1 OR TYPE 2 DIABETES   Check your blood glucose before exercising. If blood glucose levels are greater than 240 mg/dL, check for urine ketones. Do not exercise if ketones are present.  Avoid injecting insulin into areas of the body that are going to be exercised. For example, avoid injecting insulin into:  The arms when playing tennis.  The legs when jogging.  Keep a record of:  Food intake before and after you exercise.  Expected peak times of insulin action.  Blood glucose levels before and after you exercise.  The type and amount of exercise you have done.  Review your records with your health care provider. Your health care provider will help you to develop guidelines for adjusting food intake and insulin amounts before and after exercising.  If you take insulin or oral hypoglycemic agents, watch for signs and symptoms of hypoglycemia. They include:  Dizziness.  Shaking.  Sweating.  Chills.  Confusion.  Drink plenty of water while you exercise to prevent dehydration or heat stroke. Body water is lost during exercise and must be replaced.  Talk to your health care provider before starting an exercise program to make sure it is safe for you. Remember, almost any type of activity is better than  none.   This information is not intended to replace advice given to you by your health care provider. Make sure you discuss any questions you have with your health care provider.   Document Released: 06/20/2003 Document Revised: 08/14/2014 Document Reviewed: 09/06/2012 Elsevier Interactive Patient Education 2016 [employer] Basic Carbohydrate  Counting for Diabetes Mellitus Carbohydrate counting is a method for keeping track of the amount of carbohydrates you eat. Eating carbohydrates naturally increases the level of sugar (glucose) in your blood, so it is important for you to know the amount that is okay for you to have in every meal. Carbohydrate counting helps keep the level of glucose in your blood within normal limits. The amount of carbohydrates allowed is different for every person. A dietitian can help you calculate the amount that is right for you. Once you know the amount of carbohydrates you can have, you can count the carbohydrates in the foods you want to eat. Carbohydrates are found in the following foods:  Grains, such as breads and cereals.  Dried beans and soy products.  Starchy vegetables, such as potatoes, peas, and corn.  Fruit and fruit juices.  Milk and yogurt.  Sweets and snack foods, such as cake, cookies, candy, chips, soft drinks, and fruit drinks. CARBOHYDRATE COUNTING There are two ways to count the carbohydrates in your food. You can use either of the methods or a combination of both. Reading the "Nutrition Facts" on Packaged Food The "Nutrition Facts" is an area that is included on the labels of almost all packaged food and beverages in the [place]. It includes the serving size of that food or beverage and information about the nutrients in each serving of the food, including the grams (g) of carbohydrate per serving.  Decide the number of servings of this food or beverage that you will be able to eat or drink. Multiply that number of servings by the number of grams of carbohydrate that is listed on the label for that serving. The total will be the amount of carbohydrates you will be having when you eat or drink this food or beverage. Learning Standard Serving Sizes of Food When you eat food that is not packaged or does not include "Nutrition Facts" on the label, you need to measure the servings in  order to count the amount of carbohydrates.A serving of most carbohydrate-rich foods contains about 15 g of carbohydrates. The following list includes serving sizes of carbohydrate-rich foods that provide 15 g ofcarbohydrate per serving:   1 slice of bread (1 oz) or 1 six-inch tortilla.    of a hamburger bun or English muffin.  4-6 crackers.   cup unsweetened dry cereal.    cup hot cereal.   cup rice or pasta.    cup mashed potatoes or  of a large baked potato.  1 cup fresh fruit or one small piece of fruit.    cup canned or frozen fruit or fruit juice.  1 cup milk.   cup plain fat-free yogurt or yogurt sweetened with artificial sweeteners.   cup cooked dried beans or starchy vegetable, such as peas, corn, or potatoes.  Decide the number of standard-size servings that you will eat. Multiply that number of servings by 15 (the grams of carbohydrates in that serving). For example, if you eat 2 cups of strawberries, you will have eaten 2 servings and 30 g of carbohydrates (2 servings x 15 g = 30 g). For foods such as soups and casseroles, in which more  than one food is mixed in, you will need to count the carbohydrates in each food that is included. EXAMPLE OF CARBOHYDRATE COUNTING Sample Dinner  3 oz chicken breast.   cup of brown rice.   cup of corn.  1 cup milk.   1 cup strawberries with sugar-free whipped topping.  Carbohydrate Calculation Step 1: Identify the foods that contain carbohydrates:   Rice.   Corn.   Milk.   Strawberries. Step 2:Calculate the number of servings eaten of each:   2 servings of rice.   1 serving of corn.   1 serving of milk.   1 serving of strawberries. Step 3: Multiply each of those number of servings by 15 g:   2 servings of rice x 15 g = 30 g.   1 serving of corn x 15 g = 15 g.   1 serving of milk x 15 g = 15 g.   1 serving of strawberries x 15 g = 15 g. Step 4: Add together all of the amounts  to find the total grams of carbohydrates eaten: 30 g + 15 g + 15 g + 15 g = 75 g.   This information is not intended to replace advice given to you by your health care provider. Make sure you discuss any questions you have with your health care provider.   Document Released: 03/30/2005 Document Revised: 04/20/2014 Document Reviewed: 02/24/2013 Elsevier Interactive Patient Education [employer]

## 2015-06-17 ENCOUNTER — Other Ambulatory Visit: Payer: Self-pay | Admitting: Internal Medicine

## 2015-06-20 ENCOUNTER — Ambulatory Visit: Payer: Medicare Other | Attending: Internal Medicine | Admitting: Internal Medicine

## 2015-06-20 ENCOUNTER — Encounter: Payer: Self-pay | Admitting: Internal Medicine

## 2015-06-20 VITALS — BP 144/78 | HR 62 | Temp 99.0°F | Resp 18 | Ht 66.0 in | Wt 179.8 lb

## 2015-06-20 DIAGNOSIS — Z79899 Other long term (current) drug therapy: Secondary | ICD-10-CM | POA: Diagnosis not present

## 2015-06-20 DIAGNOSIS — E785 Hyperlipidemia, unspecified: Secondary | ICD-10-CM | POA: Insufficient documentation

## 2015-06-20 DIAGNOSIS — E119 Type 2 diabetes mellitus without complications: Secondary | ICD-10-CM | POA: Diagnosis not present

## 2015-06-20 DIAGNOSIS — Z7984 Long term (current) use of oral hypoglycemic drugs: Secondary | ICD-10-CM | POA: Insufficient documentation

## 2015-06-20 DIAGNOSIS — N400 Enlarged prostate without lower urinary tract symptoms: Secondary | ICD-10-CM | POA: Diagnosis not present

## 2015-06-20 DIAGNOSIS — R202 Paresthesia of skin: Secondary | ICD-10-CM | POA: Diagnosis not present

## 2015-06-20 DIAGNOSIS — R209 Unspecified disturbances of skin sensation: Secondary | ICD-10-CM | POA: Diagnosis present

## 2015-06-20 LAB — POCT GLYCOSYLATED HEMOGLOBIN (HGB A1C): HEMOGLOBIN A1C: 5.9

## 2015-06-20 LAB — GLUCOSE, POCT (MANUAL RESULT ENTRY): POC GLUCOSE: 81 mg/dL (ref 70–99)

## 2015-06-20 MED ORDER — GABAPENTIN 100 MG PO CAPS: 100.0000 mg | ORAL_CAPSULE | Freq: Three times a day (TID) | ORAL | Status: DC

## 2015-06-20 MED FILL — GABAPENTIN 100 MG CAPSULE: 100 | 30 days supply | Qty: 90 | Fill #0

## 2015-06-20 MED FILL — ROSUVASTATIN CAL 20 MG TAB: 20 | 30 days supply | Qty: 30 | Fill #2

## 2015-06-20 MED FILL — DOXAZOSIN MESYLATE 2 MG TAB: 2 | 30 days supply | Qty: 30 | Fill #3

## 2015-06-20 NOTE — Patient Instructions (Signed)
Diabetes and Exercise Exercising regularly is important. It is not just about losing weight. It has many health benefits, such as:  Improving your overall fitness, flexibility, and endurance.  Increasing your bone density.  Helping with weight control.  Decreasing your body fat.  Increasing your muscle strength.  Reducing stress and tension.  Improving your overall health. People with diabetes who exercise gain additional benefits because exercise:  Reduces appetite.  Improves the body's use of blood sugar (glucose).  Helps lower or control blood glucose.  Decreases blood pressure.  Helps control blood lipids (such as cholesterol and triglycerides).  Improves the body's use of the hormone insulin by:  Increasing the body's insulin sensitivity.  Reducing the body's insulin needs.  Decreases the risk for heart disease because exercising:  Lowers cholesterol and triglycerides levels.  Increases the levels of good cholesterol (such as high-density lipoproteins [HDL]) in the body.  Lowers blood glucose levels. YOUR ACTIVITY PLAN  Choose an activity that you enjoy, and set realistic goals. To exercise safely, you should begin practicing any new physical activity slowly, and gradually increase the intensity of the exercise over time. Your health care provider or diabetes educator can help create an activity plan that works for you. General recommendations include:  Encouraging children to engage in at least 60 minutes of physical activity each day.  Stretching and performing strength training exercises, such as yoga or weight lifting, at least 2 times per week.  Performing a total of at least 150 minutes of moderate-intensity exercise each week, such as brisk walking or water aerobics.  Exercising at least 3 days per week, making sure you allow no more than 2 consecutive days to pass without exercising.  Avoiding long periods of inactivity (90 minutes or more). When you  have to spend an extended period of time sitting down, take frequent breaks to walk or stretch. RECOMMENDATIONS FOR EXERCISING WITH TYPE 1 OR TYPE 2 DIABETES   Check your blood glucose before exercising. If blood glucose levels are greater than 240 mg/dL, check for urine ketones. Do not exercise if ketones are present.  Avoid injecting insulin into areas of the body that are going to be exercised. For example, avoid injecting insulin into:  The arms when playing tennis.  The legs when jogging.  Keep a record of:  Food intake before and after you exercise.  Expected peak times of insulin action.  Blood glucose levels before and after you exercise.  The type and amount of exercise you have done.  Review your records with your health care provider. Your health care provider will help you to develop guidelines for adjusting food intake and insulin amounts before and after exercising.  If you take insulin or oral hypoglycemic agents, watch for signs and symptoms of hypoglycemia. They include:  Dizziness.  Shaking.  Sweating.  Chills.  Confusion.  Drink plenty of water while you exercise to prevent dehydration or heat stroke. Body water is lost during exercise and must be replaced.  Talk to your health care provider before starting an exercise program to make sure it is safe for you. Remember, almost any type of activity is better than none.   This information is not intended to replace advice given to you by your health care provider. Make sure you discuss any questions you have with your health care provider.   Document Released: 06/20/2003 Document Revised: 08/14/2014 Document Reviewed: 09/06/2012 Elsevier Interactive Patient Education 2016 Elsevier Inc. Basic Carbohydrate Counting for Diabetes Mellitus Carbohydrate counting   is a method for keeping track of the amount of carbohydrates you eat. Eating carbohydrates naturally increases the level of sugar (glucose) in your  blood, so it is important for you to know the amount that is okay for you to have in every meal. Carbohydrate counting helps keep the level of glucose in your blood within normal limits. The amount of carbohydrates allowed is different for every person. A dietitian can help you calculate the amount that is right for you. Once you know the amount of carbohydrates you can have, you can count the carbohydrates in the foods you want to eat. Carbohydrates are found in the following foods:  Grains, such as breads and cereals.  Dried beans and soy products.  Starchy vegetables, such as potatoes, peas, and corn.  Fruit and fruit juices.  Milk and yogurt.  Sweets and snack foods, such as cake, cookies, candy, chips, soft drinks, and fruit drinks. CARBOHYDRATE COUNTING There are two ways to count the carbohydrates in your food. You can use either of the methods or a combination of both. Reading the "Nutrition Facts" on Packaged Food The "Nutrition Facts" is an area that is included on the labels of almost all packaged food and beverages in the [place]. It includes the serving size of that food or beverage and information about the nutrients in each serving of the food, including the grams (g) of carbohydrate per serving.  Decide the number of servings of this food or beverage that you will be able to eat or drink. Multiply that number of servings by the number of grams of carbohydrate that is listed on the label for that serving. The total will be the amount of carbohydrates you will be having when you eat or drink this food or beverage. Learning Standard Serving Sizes of Food When you eat food that is not packaged or does not include "Nutrition Facts" on the label, you need to measure the servings in order to count the amount of carbohydrates.A serving of most carbohydrate-rich foods contains about 15 g of carbohydrates. The following list includes serving sizes of carbohydrate-rich foods that  provide 15 g ofcarbohydrate per serving:   1 slice of bread (1 oz) or 1 six-inch tortilla.    of a hamburger bun or English muffin.  4-6 crackers.   cup unsweetened dry cereal.    cup hot cereal.   cup rice or pasta.    cup mashed potatoes or  of a large baked potato.  1 cup fresh fruit or one small piece of fruit.    cup canned or frozen fruit or fruit juice.  1 cup milk.   cup plain fat-free yogurt or yogurt sweetened with artificial sweeteners.   cup cooked dried beans or starchy vegetable, such as peas, corn, or potatoes.  Decide the number of standard-size servings that you will eat. Multiply that number of servings by 15 (the grams of carbohydrates in that serving). For example, if you eat 2 cups of strawberries, you will have eaten 2 servings and 30 g of carbohydrates (2 servings x 15 g = 30 g). For foods such as soups and casseroles, in which more than one food is mixed in, you will need to count the carbohydrates in each food that is included. EXAMPLE OF CARBOHYDRATE COUNTING Sample Dinner  3 oz chicken breast.   cup of brown rice.   cup of corn.  1 cup milk.   1 cup strawberries with sugar-free whipped topping.  Carbohydrate Calculation Step   1: Identify the foods that contain carbohydrates:   Rice.   Corn.   Milk.   Strawberries. Step 2:Calculate the number of servings eaten of each:   2 servings of rice.   1 serving of corn.   1 serving of milk.   1 serving of strawberries. Step 3: Multiply each of those number of servings by 15 g:   2 servings of rice x 15 g = 30 g.   1 serving of corn x 15 g = 15 g.   1 serving of milk x 15 g = 15 g.   1 serving of strawberries x 15 g = 15 g. Step 4: Add together all of the amounts to find the total grams of carbohydrates eaten: 30 g + 15 g + 15 g + 15 g = 75 g.   This information is not intended to replace advice given to you by your health care provider. Make sure you  discuss any questions you have with your health care provider.   Document Released: 03/30/2005 Document Revised: 04/20/2014 Document Reviewed: 02/24/2013 Elsevier Interactive Patient Education 2016 Elsevier Inc.  

## 2015-06-20 NOTE — Progress Notes (Signed)
Patient ID: Connor Murray, male   DOB: 03/27/1951, 65 y.o.   MRN: [id number]   Connor Murray, is a 65 y.o. male  [id number]  [id number]  DOB - 06/07/1950  Chief Complaint  Patient presents with  . Numbness    left hand        Subjective:   Connor Kidasaac Levandoski is a 65 y.o. male with history of type 2 diabetes mellitus, dyslipidemia and BPH here today for a follow up visit. Major complaint today is tingling and numbness of the teeth of the fingers of his left hand that started about 3 months ago but is gradually getting worse to the extent that he cannot knot his buttons or tie his shoes laces. He does have good strength of his left hand, no similar symptoms on the right . No history of neck injury now or remotely, he had left shoulder surgery for rotator cuff tear long time ago. His blood sugars well controlled mostly with diet but also with metformin. Patient has No headache, No chest pain, No abdominal pain - No Nausea, No Cough - SOB.  No problems updated.  ALLERGIES: No Known Allergies  PAST MEDICAL HISTORY: History reviewed. No pertinent past medical history.  MEDICATIONS AT HOME: Prior to Admission medications   Medication Sig Start Date End Date Taking? Authorizing Provider  acetaminophen-codeine (TYLENOL #3) 300-30 MG tablet TAKE 1 TABLET BY MOUTH EVERY 4 HOURS AS NEEDED 06/17/15  Yes Quentin Angstlugbemiga E Kendrix Orman, MD  diclofenac sodium (VOLTAREN) 1 % GEL Apply 4 g topically 4 (four) times daily. 05/23/15  Yes Quentin Angstlugbemiga E Cailen Texeira, MD  doxazosin (CARDURA) 2 MG tablet Take 1 tablet (2 mg total) by mouth daily. 03/21/15  Yes Quentin Angstlugbemiga E Thao Bauza, MD  metFORMIN (GLUCOPHAGE) 500 MG tablet Take 1 tablet (500 mg total) by mouth 2 (two) times daily with a meal. 03/21/15  Yes Quentin Angstlugbemiga E Donyel Castagnola, MD  rosuvastatin (CRESTOR) 20 MG tablet Take 1 tablet (20 mg total) by mouth daily. 03/21/15  Yes Quentin Angstlugbemiga E Samamtha Tiegs, MD  sildenafil (VIAGRA) 100 MG tablet Take 1 tablet (100 mg total) by mouth daily as  needed for erectile dysfunction. 03/21/15  Yes Quentin Angstlugbemiga E Madelena Maturin, MD  gabapentin (NEURONTIN) 100 MG capsule Take 1 capsule (100 mg total) by mouth 3 (three) times daily. 06/20/15   Quentin Angstlugbemiga E Quatisha Zylka, MD     Objective:   Filed Vitals:   06/20/15 1545  BP: 144/78  Pulse: 62  Temp: 99 F (37.2 C)  TempSrc: Oral  Resp: 18  Height: 5\' 6"  (1.676 m)  Weight: 179 lb 12.8 oz (81.557 kg)  SpO2: 96%    Exam General appearance : Awake, alert, not in any distress. Speech Clear. Not toxic looking HEENT: Atraumatic and Normocephalic, pupils equally reactive to light and accomodation Neck: supple, no JVD. No cervical lymphadenopathy.  Chest:Good air entry bilaterally, no added sounds  CVS: S1 S2 regular, no murmurs.  Abdomen: Bowel sounds present, Non tender and not distended with no gaurding, rigidity or rebound. Extremities: B/L Lower Ext shows no edema, both legs are warm to touch Neurology: Awake alert, and oriented X 3, CN II-XII intact, Non focal Skin:No Rash  Data Review Lab Results  Component Value Date   HGBA1C 5.9 06/20/2015   HGBA1C 6.90 03/21/2015   HGBA1C 6.50 08/20/2014     Assessment & Plan   1. New onset type 2 diabetes mellitus (HCC)  - POCT A1C - Glucose (CBG) - Microalbumin/Creatinine Ratio, Urine  Aim for 30 minutes of exercise most  days. Rethink what you drink. Water is great! Aim for 2-3 Carb Choices per meal (30-45 grams) +/- 1 either way  Aim for 0-15 Carbs per snack if hungry  Include protein in moderation with your meals and snacks  Consider reading food labels for Total Carbohydrate and Fat Grams of foods  Consider checking BG at alternate times per day  Continue taking medication as directed Be mindful about how much sugar you are adding to beverages and other foods. Fruit Punch - find one with no sugar  Measure and decrease portions of carbohydrate foods  Make your plate and don't go back for seconds   2. Left hand paresthesia  - MR  Cervical Spine Wo Contrast; Future - gabapentin (NEURONTIN) 100 MG capsule; Take 1 capsule (100 mg total) by mouth 3 (three) times daily.  Dispense: 90 capsule; Refill: 3  Patient have been counseled extensively about nutrition and exercise  Return in about 3 months (around 09/20/2015) for Hemoglobin A1C and Follow up, DM, Follow up HTN, Follow up Pain and comorbidities.  The patient was given clear instructions to go to ER or return to medical center if symptoms don't improve, worsen or new problems develop. The patient verbalized understanding. The patient was told to call to get lab results if they haven't heard anything in the next week.   This note has been created with Education officer, environmental. Any transcriptional errors are unintentional.    Jeanann Lewandowsky, MD, MHA, Maxwell Caul, CPE [facility] [place], [place] [phone]   06/20/2015, 4:33 PM

## 2015-06-20 NOTE — Progress Notes (Signed)
Patient here for finger numbness.  Patient complains of finger tip numbness in left hand which radiates up the arm. During the night patient is not able to sleep due to discomfort.

## 2015-06-21 ENCOUNTER — Other Ambulatory Visit: Payer: Self-pay | Admitting: Internal Medicine

## 2015-06-21 LAB — MICROALBUMIN / CREATININE URINE RATIO
CREATININE, URINE: 198 mg/dL (ref 20–370)
MICROALB/CREAT RATIO: 3 ug/mg{creat} (ref <30)
Microalb, Ur: 0.6 mg/dL

## 2015-06-21 MED FILL — metFORMIN HCL 500 MG TABS: 500 | 30 days supply | Qty: 60 | Fill #3

## 2015-06-24 ENCOUNTER — Ambulatory Visit (HOSPITAL_COMMUNITY): Admission: RE | Admit: 2015-06-24 | Payer: Medicare Other | Source: Ambulatory Visit

## 2015-07-02 ENCOUNTER — Ambulatory Visit (HOSPITAL_COMMUNITY)
Admission: RE | Admit: 2015-07-02 | Discharge: 2015-07-02 | Disposition: A | Discharge status: Home / Self Care | Payer: Medicare Other | Source: Ambulatory Visit | Attending: Internal Medicine | Admitting: Internal Medicine

## 2015-07-02 DIAGNOSIS — M4602 Spinal enthesopathy, cervical region: Secondary | ICD-10-CM | POA: Insufficient documentation

## 2015-07-02 DIAGNOSIS — R202 Paresthesia of skin: Secondary | ICD-10-CM | POA: Diagnosis present

## 2015-07-02 DIAGNOSIS — M4802 Spinal stenosis, cervical region: Secondary | ICD-10-CM | POA: Diagnosis not present

## 2015-07-05 ENCOUNTER — Other Ambulatory Visit: Payer: Self-pay | Admitting: Internal Medicine

## 2015-07-05 DIAGNOSIS — M5412 Radiculopathy, cervical region: Secondary | ICD-10-CM

## 2015-07-11 ENCOUNTER — Telehealth: Payer: Self-pay | Admitting: *Deleted

## 2015-07-11 NOTE — Telephone Encounter (Signed)
Patients wife took a message for patient to return [facility] [place] [facility] [place] [facility]

## 2015-07-11 NOTE — Telephone Encounter (Signed)
-----   Message from Quentin Angstlugbemiga E Jegede, MD sent at 07/05/2015 10:15 AM EDT ----- Please inform patient that his cervical spine MRI showed multiple level narrowing which may be responsible for his symptoms. We will refer patient to neurosurgery for further management. However continue gabapentin as prescribed. Referral ordered.

## 2015-07-12 ENCOUNTER — Telehealth: Payer: Self-pay | Admitting: *Deleted

## 2015-07-12 NOTE — Telephone Encounter (Signed)
Patient verified DOB Patient is aware of MRI of Spine showing narrowing. Patient is aware of referral being placed to [place] [facility] for further [place]. Patient wrote down name and contact information. Patient advised to continue with gabapentin. Patient expressed his understanding and had no further questions at this time.

## 2015-07-12 NOTE — Telephone Encounter (Signed)
Medical Assistant left message on patient's home and cell voicemail. Voicemail states to give a call back [facility] [place] [facility] [phone]. [facility]

## 2015-07-12 NOTE — Telephone Encounter (Signed)
Pt. Returned call. Please f/u with pt. °

## 2015-07-15 MED FILL — GABAPENTIN 100 MG CAPSULE: 100 | 30 days supply | Qty: 90 | Fill #1

## 2015-07-15 MED FILL — ROSUVASTATIN CAL 20 MG TAB: 20 | 30 days supply | Qty: 30 | Fill #3

## 2015-07-16 MED FILL — !VIAGRA 100MG TABLET: 100 | 30 days supply | Qty: 10 | Fill #2

## 2015-07-18 DIAGNOSIS — M4722 Other spondylosis with radiculopathy, cervical region: Secondary | ICD-10-CM | POA: Diagnosis not present

## 2015-07-24 DIAGNOSIS — M4722 Other spondylosis with radiculopathy, cervical region: Secondary | ICD-10-CM | POA: Diagnosis not present

## 2015-07-24 DIAGNOSIS — M5412 Radiculopathy, cervical region: Secondary | ICD-10-CM | POA: Diagnosis not present

## 2015-07-24 DIAGNOSIS — M4802 Spinal stenosis, cervical region: Secondary | ICD-10-CM | POA: Diagnosis not present

## 2015-07-29 ENCOUNTER — Emergency Department (HOSPITAL_COMMUNITY): Payer: Commercial Managed Care - HMO

## 2015-07-29 ENCOUNTER — Encounter (HOSPITAL_COMMUNITY): Payer: Self-pay | Admitting: *Deleted

## 2015-07-29 ENCOUNTER — Emergency Department (HOSPITAL_COMMUNITY)
Admission: EM | Admit: 2015-07-29 | Discharge: 2015-07-29 | Disposition: A | Discharge status: Home / Self Care | Payer: Commercial Managed Care - HMO | Attending: Emergency Medicine | Admitting: Emergency Medicine

## 2015-07-29 DIAGNOSIS — K2289 Other specified disease of esophagus: Secondary | ICD-10-CM

## 2015-07-29 DIAGNOSIS — R0602 Shortness of breath: Secondary | ICD-10-CM | POA: Diagnosis not present

## 2015-07-29 DIAGNOSIS — R05 Cough: Secondary | ICD-10-CM | POA: Insufficient documentation

## 2015-07-29 DIAGNOSIS — Z79899 Other long term (current) drug therapy: Secondary | ICD-10-CM | POA: Diagnosis not present

## 2015-07-29 DIAGNOSIS — E119 Type 2 diabetes mellitus without complications: Secondary | ICD-10-CM | POA: Insufficient documentation

## 2015-07-29 DIAGNOSIS — R079 Chest pain, unspecified: Secondary | ICD-10-CM | POA: Insufficient documentation

## 2015-07-29 DIAGNOSIS — M7981 Nontraumatic hematoma of soft tissue: Secondary | ICD-10-CM | POA: Insufficient documentation

## 2015-07-29 DIAGNOSIS — Z7984 Long term (current) use of oral hypoglycemic drugs: Secondary | ICD-10-CM | POA: Insufficient documentation

## 2015-07-29 DIAGNOSIS — M79645 Pain in left finger(s): Secondary | ICD-10-CM | POA: Insufficient documentation

## 2015-07-29 DIAGNOSIS — R200 Anesthesia of skin: Secondary | ICD-10-CM | POA: Diagnosis present

## 2015-07-29 DIAGNOSIS — K228 Other specified diseases of esophagus: Secondary | ICD-10-CM

## 2015-07-29 DIAGNOSIS — S27812A Contusion of esophagus (thoracic part), initial encounter: Secondary | ICD-10-CM | POA: Diagnosis not present

## 2015-07-29 DIAGNOSIS — J029 Acute pharyngitis, unspecified: Secondary | ICD-10-CM | POA: Diagnosis not present

## 2015-07-29 DIAGNOSIS — R221 Localized swelling, mass and lump, neck: Secondary | ICD-10-CM | POA: Diagnosis not present

## 2015-07-29 DIAGNOSIS — R1310 Dysphagia, unspecified: Secondary | ICD-10-CM | POA: Diagnosis not present

## 2015-07-29 HISTORY — DX: Type 2 diabetes mellitus without complications: E11.9

## 2015-07-29 LAB — CBC
HEMATOCRIT: 38.9 % — AB (ref 39.0–52.0)
HEMOGLOBIN: 12.7 g/dL — AB (ref 13.0–17.0)
MCH: 30.1 pg (ref 26.0–34.0)
MCHC: 32.6 g/dL (ref 30.0–36.0)
MCV: 92.2 fL (ref 78.0–100.0)
PLATELETS: 190 10*3/uL (ref 150–400)
RBC: 4.22 MIL/uL (ref 4.22–5.81)
RDW: 12.9 % (ref 11.5–15.5)
WBC: 7.9 10*3/uL (ref 4.0–10.5)

## 2015-07-29 LAB — BASIC METABOLIC PANEL
Anion gap: 10 (ref 5–15)
BUN: 12 mg/dL (ref 6–20)
CHLORIDE: 99 mmol/L — AB (ref 101–111)
CO2: 27 mmol/L (ref 22–32)
Calcium: 9.6 mg/dL (ref 8.9–10.3)
Creatinine, Ser: 0.89 mg/dL (ref 0.61–1.24)
GFR calc non Af Amer: >60 mL/min (ref >60)
Glucose, Bld: 147 mg/dL — ABNORMAL HIGH (ref 65–99)
POTASSIUM: 4.2 mmol/L (ref 3.5–5.1)
SODIUM: 136 mmol/L (ref 135–145)

## 2015-07-29 LAB — I-STAT TROPONIN, ED: Troponin i, poc: 0 ng/mL (ref 0.00–0.08)

## 2015-07-29 MED ORDER — IOPAMIDOL (ISOVUE-370) INJECTION 76%
INTRAVENOUS | Status: AC
  Administered 2015-07-29: 80 mL
  Filled 2015-07-29: qty 100

## 2015-07-29 MED ORDER — DEXAMETHASONE 4 MG PO TABS
6.0000 mg | ORAL_TABLET | Freq: Once | ORAL | Status: AC
  Administered 2015-07-29: 6 mg via ORAL
  Filled 2015-07-29: qty 2

## 2015-07-29 NOTE — ED Provider Notes (Signed)
The patient's neurosurgeon has seen and evaluated the patient. Patient will receive Decadron, follow-up as an outpatient.   Gerhard Munchobert Jazleen Robeck, MD 07/29/15 [id number]

## 2015-07-29 NOTE — ED Notes (Signed)
Patient able to ambulate independently  

## 2015-07-29 NOTE — ED Notes (Signed)
Pt states he recently had surgery(3 days ago?) to alleviate numbness to L arm (Dr Bevely Palmeritty?).  States for three days, since the surgery, he has been experiencing L sided chest pain, back pain  And L arm pain and numbness.  Also c/o pain with swallowing since surgery.  Airway patent.

## 2015-07-29 NOTE — Discharge Instructions (Signed)
Call Dr. Bevely Palmeritty for your follow-up appointment.  Soft diet.  continue pain medications as prescribed

## 2015-07-29 NOTE — ED Provider Notes (Signed)
CSN: [id number]     Arrival date & time 07/29/15  1052 History   First MD Initiated Contact with Patient 07/29/15 1326     Chief Complaint  Patient presents with  . Arm Pain      HPI  Patient presents for evaluation of sore throat, breathing, chest pain, arm numbness.  Patient has a history of a recent anterior cervical discectomy/fusion with Dr. Omelia Blackwater 5 days ago. This was done as an outpatient for symptomatic left upper extremity radiculopathy. He reports pain and numbness to his thumb, index, middle finger. Procedure was apparently without difficulty. His pain in his left arm has resolved. However, he continues to have numbness in these 3 fingers. Has pain with swallowing since surgery. She had a fever yesterday. Had a cough and some left-sided chest pain center for evaluation. Has not been hoarse. No hemoptysis.  Past Medical History  Diagnosis Date  . Diabetes mellitus without complication (HCC)    History reviewed. No pertinent past surgical history. No family history on file. Social History  Substance Use Topics  . Smoking status: Never Smoker   . Smokeless tobacco: None  . Alcohol Use: No    Review of Systems  Constitutional: Negative for fever, chills, diaphoresis, appetite change and fatigue.  HENT: Positive for sore throat. Negative for mouth sores and trouble swallowing.        Odynophagia, no dysphagia with solids or liquids.  Eyes: Negative for visual disturbance.  Respiratory: Negative for cough, chest tightness, shortness of breath and wheezing.        Complains of left-sided chest pain and a cough  Cardiovascular: Negative for chest pain.  Gastrointestinal: Negative for nausea, vomiting, abdominal pain, diarrhea and abdominal distention.  Endocrine: Negative for polydipsia, polyphagia and polyuria.  Genitourinary: Negative for dysuria, frequency and hematuria.  Musculoskeletal: Negative for gait problem.  Skin: Negative for color change, pallor and rash.    Neurological: Negative for dizziness, syncope, speech difficulty, light-headedness and headaches.  Hematological: Does not bruise/bleed easily.  Psychiatric/Behavioral: Negative for behavioral problems and confusion.      Allergies  Review of patient's allergies indicates no known allergies.  Home Medications   Prior to Admission medications   Medication Sig Start Date End Date Taking? Authorizing Provider  acetaminophen-codeine (TYLENOL #3) 300-30 MG tablet TAKE 1 TABLET BY MOUTH EVERY 4 HOURS AS NEEDED 06/17/15   Quentin Angst, MD  diclofenac sodium (VOLTAREN) 1 % GEL Apply 4 g topically 4 (four) times daily. 07/31/15   Quentin Angst, MD  doxazosin (CARDURA) 2 MG tablet Take 1 tablet (2 mg total) by mouth daily. 03/21/15   Quentin Angst, MD  gabapentin (NEURONTIN) 100 MG capsule Take 1 capsule (100 mg total) by mouth 3 (three) times daily. 06/20/15   Quentin Angst, MD  metFORMIN (GLUCOPHAGE) 500 MG tablet Take 1 tablet (500 mg total) by mouth 2 (two) times daily with a meal. 03/21/15   Quentin Angst, MD  rosuvastatin (CRESTOR) 20 MG tablet Take 1 tablet (20 mg total) by mouth daily. 03/21/15   Quentin Angst, MD  sildenafil (VIAGRA) 100 MG tablet Take 1 tablet (100 mg total) by mouth daily as needed for erectile dysfunction. 03/21/15   Quentin Angst, MD   BP 153/76 mmHg  Pulse 72  Temp(Src) 97.5 F (36.4 C) (Oral)  Resp 16  Ht  (1.676 m)  Wt 180 lb (81.647 kg)  BMI 29.07 kg/m2  SpO2 98% Physical Exam  Constitutional: He  is oriented to person, place, and time. He appears well-developed and well-nourished. No distress.  HENT:  Head: Normocephalic.  Eyes: Conjunctivae are normal. Pupils are equal, round, and reactive to light. No scleral icterus.  Neck: Normal range of motion. Neck supple. No thyromegaly present.    Cardiovascular: Normal rate and regular rhythm.  Exam reveals no gallop and no friction rub.   No murmur  heard. Pulmonary/Chest: Effort normal and breath sounds normal. No respiratory distress. He has no wheezes. He has no rales.  Bilateral breath sounds. No diminished breath sounds. No wheezing rales or rhonchi. Not tachypneic or tachycardic. Not hypoxemic.  Abdominal: Soft. Bowel sounds are normal. He exhibits no distension. There is no tenderness. There is no rebound.  Musculoskeletal: Normal range of motion.  Neurological: He is alert and oriented to person, place, and time.  Skin: Skin is warm and dry. No rash noted.  Psychiatric: He has a normal mood and affect. His behavior is normal.    ED Course  Procedures (including critical care time) Labs Review Labs Reviewed  BASIC METABOLIC PANEL - Abnormal; Notable for the following:    Chloride 99 (*)    Glucose, Bld 147 (*)    All other components within normal limits  CBC - Abnormal; Notable for the following:    Hemoglobin 12.7 (*)    HCT 38.9 (*)    All other components within normal limits  I-STAT TROPOININ, ED    Imaging Review No results found. I have personally reviewed and evaluated these images and lab results as part of my medical decision-making.    MDM   Final diagnoses:  Intramural esophageal hematoma    Plan CT of the neck soft tissues as well as CT angiogram rule out PE. Rule out hematoma or abscess. He is nontoxic not febrile normal voice normal swallowing. May be simple symptomatic treatment.  Patient's pain in his left arm has resolved. He reports persistent numbness. This may be simple matter of time, is easily 5 days postop. No indication for acute MRI regarding his neurological symptoms which overall are improving.    Rolland PorterMark Sundra Haddix, MD 08/01/15 1525

## 2015-07-29 NOTE — Consult Note (Signed)
CC:  Chief Complaint  Patient presents with  . Arm Pain    HPI: Connor Murray is a 65 y.o. male s/p C5-6, C6-7 ACDF last Wednesday.  He presents with dysphagia and subjective fevers since yesterday.  He has not had any hoarseness or voice changes.  He has good motor strength in his arms.  He complains of pain in the back of his neck as well as with swallowing.  PMH: Past Medical History  Diagnosis Date  . Diabetes mellitus without complication (HCC)     PSH: History reviewed. No pertinent past surgical history.  SH: Social History  Substance Use Topics  . Smoking status: Never Smoker   . Smokeless tobacco: None  . Alcohol Use: No    MEDS: Prior to Admission medications   Medication Sig Start Date End Date Taking? Authorizing Provider  acetaminophen-codeine (TYLENOL #3) 300-30 MG tablet TAKE 1 TABLET BY MOUTH EVERY 4 HOURS AS NEEDED 06/17/15   Quentin Angstlugbemiga E Jegede, MD  diclofenac sodium (VOLTAREN) 1 % GEL Apply 4 g topically 4 (four) times daily. 05/23/15   Quentin Angstlugbemiga E Jegede, MD  doxazosin (CARDURA) 2 MG tablet Take 1 tablet (2 mg total) by mouth daily. 03/21/15   Quentin Angstlugbemiga E Jegede, MD  gabapentin (NEURONTIN) 100 MG capsule Take 1 capsule (100 mg total) by mouth 3 (three) times daily. 06/20/15   Quentin Angstlugbemiga E Jegede, MD  metFORMIN (GLUCOPHAGE) 500 MG tablet Take 1 tablet (500 mg total) by mouth 2 (two) times daily with a meal. 03/21/15   Quentin Angstlugbemiga E Jegede, MD  rosuvastatin (CRESTOR) 20 MG tablet Take 1 tablet (20 mg total) by mouth daily. 03/21/15   Quentin Angstlugbemiga E Jegede, MD  sildenafil (VIAGRA) 100 MG tablet Take 1 tablet (100 mg total) by mouth daily as needed for erectile dysfunction. 03/21/15   Quentin Angstlugbemiga E Jegede, MD    ALLERGY: No Known Allergies  ROS: ROS  Subjective fevers.  Pain with swallowing.  Pain in the back of his neck.  No weakness or numbness.  NEUROLOGIC EXAM: Awake, alert, oriented Motor exam: Upper Extremities Deltoid Bicep Tricep Grip  Right 5/5 5/5 5/5  5/5  Left 5/5 5/5 5/5 5/5   Lower Extremity IP Quad PF DF EHL  Right 5/5 5/5 5/5 5/5 5/5  Left 5/5 5/5 5/5 5/5 5/5   Sensation grossly intact to LT Neck flat Voice normal Incision c/d/i  IMAGING: Circumscribed prevertebral mass most consistent with prevertebral post-operative hematoma.  Possibly esophageal mural hematoma.  Does not appear to be an infectious process.  Absence of gas not consistent with espohageal perforation.  IMPRESSION: - 65 y.o. male with dysphagia after an ACDF.  Imaging looks like a pre-vertebral hematoma.  Afebrile here with normal white count.  Most consistent with post op prevertebral hematoma.  Patient is breathing comfortably, able to swallow ok.  I offered hospital admission with IV steroids but also told him I expect this will likely resolve on its own.  He would prefer to go home.  PLAN: - 6 mg decadron IV x1 - Keep scheduled follow up - Return to ER should things worsen

## 2015-07-30 MED FILL — ?DOXAZOSIN MESYLATE 2 MG TA: 2 MG | 30 days supply | Qty: 15 | Fill #1

## 2015-07-30 MED FILL — ?METFORMIN HCL 500MG TABLET: 500 | 30 days supply | Qty: 60 | Fill #4

## 2015-07-31 ENCOUNTER — Other Ambulatory Visit: Payer: Self-pay | Admitting: Pharmacist

## 2015-07-31 DIAGNOSIS — R202 Paresthesia of skin: Secondary | ICD-10-CM

## 2015-07-31 MED ORDER — DICLOFENAC SODIUM 1 % TD GEL: 4.0000 g | Freq: Four times a day (QID) | TRANSDERMAL | Status: DC

## 2015-08-01 ENCOUNTER — Ambulatory Visit: Payer: Commercial Managed Care - HMO | Attending: Internal Medicine | Admitting: Internal Medicine

## 2015-08-01 ENCOUNTER — Encounter: Payer: Self-pay | Admitting: Internal Medicine

## 2015-08-01 ENCOUNTER — Telehealth: Payer: Self-pay | Admitting: Internal Medicine

## 2015-08-01 VITALS — BP 128/74 | HR 62 | Temp 97.9°F | Resp 18 | Ht 66.0 in | Wt 182.0 lb

## 2015-08-01 DIAGNOSIS — Z79899 Other long term (current) drug therapy: Secondary | ICD-10-CM | POA: Insufficient documentation

## 2015-08-01 DIAGNOSIS — R202 Paresthesia of skin: Secondary | ICD-10-CM | POA: Diagnosis not present

## 2015-08-01 DIAGNOSIS — Z7984 Long term (current) use of oral hypoglycemic drugs: Secondary | ICD-10-CM | POA: Insufficient documentation

## 2015-08-01 DIAGNOSIS — E119 Type 2 diabetes mellitus without complications: Secondary | ICD-10-CM

## 2015-08-01 DIAGNOSIS — E785 Hyperlipidemia, unspecified: Secondary | ICD-10-CM

## 2015-08-01 NOTE — Patient Instructions (Signed)

## 2015-08-01 NOTE — Telephone Encounter (Signed)
Pt. Came into facility stating that he had a kidney test done and would like his results. Please f/u with pt.

## 2015-08-01 NOTE — Progress Notes (Signed)
Patient ID: Deaundra Kutzer, male   DOB: 03/28/1951, 65 y.o.   MRN: [id number]   Issa Kosmicki, is a 65 y.o. male  [id number]  NFA:[id number]  DOB - 04/07/1951  Chief Complaint  Patient presents with  . Follow-up    test results        Subjective:   Brayden Brodhead is a 65 y.o. male here today for a follow up visit for surgery. Patient recently had anterior cervical discectomy/fusion for cervical spinal stenosis and foraminal narrowing. Patient is doing well post surgery so far, his numbness is still there but slowly improving, neck pain is much improved, no new symptom. Patient is very hopeful that if the numbness would be better. He presented to the ED a few days ago because of difficulty swallowing after surgery, CT showed possible hematoma or seroma due to possible esophageal injury, patient was managed conservatively. He feels much better now, he is able to swallow and eat normally now. He has no fever. He has no cough. No chest pain. No shortness of breath. Patient has No headache, No abdominal pain - No Nausea, No new weakness tingling or numbness.  Problem  Type 2 Diabetes Mellitus Without Complication, Without Long-Term Current Use of Insulin (Hcc)  New Onset Type 2 Diabetes Mellitus (Hcc) (Resolved)    ALLERGIES: No Known Allergies  PAST MEDICAL HISTORY: Past Medical History  Diagnosis Date  . Diabetes mellitus without complication (HCC)     MEDICATIONS AT HOME: Prior to Admission medications   Medication Sig Start Date End Date Taking? Authorizing Provider  acetaminophen-codeine (TYLENOL #3) 300-30 MG tablet TAKE 1 TABLET BY MOUTH EVERY 4 HOURS AS NEEDED 06/17/15   Quentin Angst, MD  diclofenac sodium (VOLTAREN) 1 % GEL Apply 4 g topically 4 (four) times daily. 07/31/15   Quentin Angst, MD  doxazosin (CARDURA) 2 MG tablet Take 1 tablet (2 mg total) by mouth daily. 03/21/15   Quentin Angst, MD  gabapentin (NEURONTIN) 100 MG capsule Take 1 capsule (100 mg  total) by mouth 3 (three) times daily. 06/20/15   Quentin Angst, MD  metFORMIN (GLUCOPHAGE) 500 MG tablet Take 1 tablet (500 mg total) by mouth 2 (two) times daily with a meal. 03/21/15   Quentin Angst, MD  rosuvastatin (CRESTOR) 20 MG tablet Take 1 tablet (20 mg total) by mouth daily. 03/21/15   Quentin Angst, MD  sildenafil (VIAGRA) 100 MG tablet Take 1 tablet (100 mg total) by mouth daily as needed for erectile dysfunction. 03/21/15   Quentin Angst, MD     Objective:   Filed Vitals:   08/01/15 1017  BP: 128/74  Pulse: 62  Temp: 97.9 F (36.6 C)  TempSrc: Oral  Resp: 18  Height:  (1.676 m)  Weight: 182 lb (82.555 kg)  SpO2: 97%    Exam General appearance : Awake, alert, not in any distress. Speech Clear. Not toxic looking HEENT: Atraumatic and Normocephalic, pupils equally reactive to light and accomodation Neck: Neurosurgical scar, supple, no JVD. No cervical lymphadenopathy.  Chest:Good air entry bilaterally, no added sounds  CVS: S1 S2 regular, no murmurs.  Abdomen: Bowel sounds present, Non tender and not distended with no gaurding, rigidity or rebound. Extremities: B/L Lower Ext shows no edema, both legs are warm to touch Neurology: Awake alert, and oriented X 3, CN II-XII intact, Non focal Skin:No Rash  Data Review Lab Results  Component Value Date   HGBA1C 5.9 06/20/2015   HGBA1C 6.90 03/21/2015  HGBA1C 6.50 08/20/2014     Assessment & Plan   1. Dyslipidemia  To address this please limit saturated fat to no more than 7% of your calories, limit cholesterol to 200 mg/day, increase fiber and exercise as tolerated. If needed we may add another cholesterol lowering medication to your regimen.   2. Left hand paresthesia S/P Surgery Doing well Follow up with neurosurgery  3. Type 2 diabetes mellitus without complication, without long-term current use of insulin (HCC)  Aim for 30 minutes of exercise most days. Rethink what you drink.  Water is great! Aim for 2-3 Carb Choices per meal (30-45 grams) +/- 1 either way  Aim for 0-15 Carbs per snack if hungry  Include protein in moderation with your meals and snacks  Consider reading food labels for Total Carbohydrate and Fat Grams of foods  Consider checking BG at alternate times per day  Continue taking medication as directed Be mindful about how much sugar you are adding to beverages and other foods. Fruit Punch - find one with no sugar  Measure and decrease portions of carbohydrate foods  Make your plate and don't go back for seconds  Patient have been counseled extensively about nutrition and exercise  Return in about 3 months (around 10/31/2015) for Follow up Pain and comorbidities, Follow up HTN.  The patient was given clear instructions to go to ER or return to medical center if symptoms don't improve, worsen or new problems develop. The patient verbalized understanding. The patient was told to call to get lab results if they haven't heard anything in the next week.   This note has been created with Education officer, environmentalDragon speech recognition software and smart phrase technology. Any transcriptional errors are unintentional.    Jeanann LewandowskyJEGEDE, Jaynia Fendley, MD, MHA, FACP, FAAP, CPE [facility] [place], [place] [phone]   08/01/2015, 10:50 AM

## 2015-08-01 NOTE — Progress Notes (Signed)
Patient here for results of recent "kidney test"-CT Also wants to discuss wife's immunization record. Does not want blood sugar tested today. Repeated several times "I am not sick."

## 2015-08-20 ENCOUNTER — Other Ambulatory Visit: Payer: Self-pay | Admitting: Internal Medicine

## 2015-08-20 NOTE — Telephone Encounter (Signed)
Pt. Called requesting to be referred out the the optometrist. Pt. Has [employer]. Please f/u ° °

## 2015-08-26 NOTE — Telephone Encounter (Signed)
Pt. Called requesting to be referred out the the optometrist. Pt. Has [employer]. Please f/u

## 2015-08-30 DIAGNOSIS — E119 Type 2 diabetes mellitus without complications: Secondary | ICD-10-CM | POA: Diagnosis not present

## 2015-08-30 DIAGNOSIS — Z0100 Encounter for examination of eyes and vision without abnormal findings: Secondary | ICD-10-CM | POA: Diagnosis not present

## 2015-08-30 MED FILL — ROSUVASTATIN CAL 20 MG TAB: 20 | 30 days supply | Qty: 30 | Fill #4

## 2015-08-30 MED FILL — !VIAGRA 100MG TABLET: 100 | 30 days supply | Qty: 4 | Fill #3

## 2015-08-30 MED FILL — GABAPENTIN 100 MG CAPSULE: 100 | 30 days supply | Qty: 90 | Fill #2

## 2015-08-30 MED FILL — DOXAZOSIN MESYLATE 2 MG TAB: 2 | 30 days supply | Qty: 15 | Fill #0

## 2015-08-30 MED FILL — metFORMIN HCL 500 MG TABS: 500 | 30 days supply | Qty: 60 | Fill #5

## 2015-09-03 DIAGNOSIS — Z0100 Encounter for examination of eyes and vision without abnormal findings: Secondary | ICD-10-CM | POA: Diagnosis not present

## 2015-09-27 DIAGNOSIS — Z6829 Body mass index (BMI) 29.0-29.9, adult: Secondary | ICD-10-CM | POA: Diagnosis not present

## 2015-09-27 DIAGNOSIS — G5603 Carpal tunnel syndrome, bilateral upper limbs: Secondary | ICD-10-CM | POA: Diagnosis not present

## 2015-09-27 MED FILL — DOXAZOSIN MESYLATE 2 MG TAB: 2 | 30 days supply | Qty: 15 | Fill #1

## 2015-09-27 MED FILL — GABAPENTIN 100 MG CAPSULE: 100 | 30 days supply | Qty: 90 | Fill #3

## 2015-09-27 MED FILL — metFORMIN HCL 500 MG TABS: 500 | 30 days supply | Qty: 60 | Fill #6

## 2015-09-27 MED FILL — ROSUVASTATIN CAL 20 MG TAB: 20 | 30 days supply | Qty: 30 | Fill #5

## 2015-10-02 ENCOUNTER — Other Ambulatory Visit: Payer: Self-pay | Admitting: *Deleted

## 2015-10-02 DIAGNOSIS — F528 Other sexual dysfunction not due to a substance or known physiological condition: Secondary | ICD-10-CM

## 2015-10-02 MED ORDER — SILDENAFIL CITRATE 100 MG PO TABS: 100.0000 mg | ORAL_TABLET | Freq: Every day | ORAL | Status: DC | PRN

## 2015-10-02 NOTE — Telephone Encounter (Signed)
PASS PROGRAM 

## 2015-10-03 ENCOUNTER — Telehealth: Payer: Self-pay | Admitting: *Deleted

## 2015-10-03 DIAGNOSIS — E119 Type 2 diabetes mellitus without complications: Secondary | ICD-10-CM

## 2015-10-03 NOTE — Telephone Encounter (Signed)
Referral has been placed. 

## 2015-10-03 NOTE — Telephone Encounter (Signed)
Referral for Opthamology has been placed. MA does not see any results for a kidney test.

## 2015-10-21 MED FILL — DOXAZOSIN MESYLATE 2 MG TAB: 2 | 30 days supply | Qty: 15 | Fill #2

## 2015-10-21 MED FILL — metFORMIN HCL 500 MG TABS: 500 | 30 days supply | Qty: 60 | Fill #7

## 2015-10-21 MED FILL — ROSUVASTATIN CAL 20 MG TAB: 20 | 30 days supply | Qty: 30 | Fill #6

## 2015-10-31 DIAGNOSIS — G5602 Carpal tunnel syndrome, left upper limb: Secondary | ICD-10-CM | POA: Diagnosis not present

## 2015-10-31 DIAGNOSIS — Z6829 Body mass index (BMI) 29.0-29.9, adult: Secondary | ICD-10-CM | POA: Diagnosis not present

## 2015-10-31 DIAGNOSIS — M4722 Other spondylosis with radiculopathy, cervical region: Secondary | ICD-10-CM | POA: Diagnosis not present

## 2015-11-05 MED FILL — !VIAGRA 100MG TABLET: 100 | 30 days supply | Qty: 10 | Fill #4

## 2015-11-06 DIAGNOSIS — G5602 Carpal tunnel syndrome, left upper limb: Secondary | ICD-10-CM | POA: Diagnosis not present

## 2015-11-20 MED FILL — metFORMIN HCL 500 MG TABS: 500 | 30 days supply | Qty: 60 | Fill #8

## 2015-11-20 MED FILL — ROSUVASTATIN CAL 20 MG TAB: 20 | 30 days supply | Qty: 30 | Fill #7

## 2015-11-22 ENCOUNTER — Other Ambulatory Visit: Payer: Self-pay | Admitting: Pharmacist

## 2015-11-22 DIAGNOSIS — E119 Type 2 diabetes mellitus without complications: Secondary | ICD-10-CM

## 2015-11-22 DIAGNOSIS — R202 Paresthesia of skin: Secondary | ICD-10-CM

## 2015-11-22 MED ORDER — METFORMIN HCL 500 MG PO TABS: 500.0000 mg | ORAL_TABLET | Freq: Two times a day (BID) | ORAL | 0 refills | Status: DC

## 2015-11-22 MED ORDER — DOXAZOSIN MESYLATE 1 MG PO TABS: 0.5000 mg | ORAL_TABLET | Freq: Every day | ORAL | 0 refills | Status: DC

## 2015-11-22 MED ORDER — GABAPENTIN 100 MG PO CAPS: 100.0000 mg | ORAL_CAPSULE | Freq: Three times a day (TID) | ORAL | 0 refills | Status: DC

## 2015-11-26 ENCOUNTER — Other Ambulatory Visit: Payer: Self-pay | Admitting: Internal Medicine

## 2015-11-26 DIAGNOSIS — M79642 Pain in left hand: Secondary | ICD-10-CM | POA: Diagnosis not present

## 2015-11-26 DIAGNOSIS — M6281 Muscle weakness (generalized): Secondary | ICD-10-CM | POA: Diagnosis not present

## 2015-11-26 DIAGNOSIS — R202 Paresthesia of skin: Secondary | ICD-10-CM

## 2015-11-26 DIAGNOSIS — G5602 Carpal tunnel syndrome, left upper limb: Secondary | ICD-10-CM | POA: Diagnosis not present

## 2015-11-26 DIAGNOSIS — M25642 Stiffness of left hand, not elsewhere classified: Secondary | ICD-10-CM | POA: Diagnosis not present

## 2015-11-26 MED FILL — DOXAZOSIN MESYLATE 2 MG TAB: 2 | 30 days supply | Qty: 15 | Fill #0

## 2015-12-04 ENCOUNTER — Encounter: Payer: Self-pay | Admitting: Internal Medicine

## 2015-12-04 ENCOUNTER — Ambulatory Visit: Payer: Commercial Managed Care - HMO | Attending: Internal Medicine | Admitting: Internal Medicine

## 2015-12-04 VITALS — BP 132/69 | HR 57 | Temp 98.4°F | Resp 18 | Ht 66.0 in | Wt 182.6 lb

## 2015-12-04 DIAGNOSIS — E119 Type 2 diabetes mellitus without complications: Secondary | ICD-10-CM | POA: Insufficient documentation

## 2015-12-04 DIAGNOSIS — Z23 Encounter for immunization: Secondary | ICD-10-CM | POA: Diagnosis not present

## 2015-12-04 DIAGNOSIS — N529 Male erectile dysfunction, unspecified: Secondary | ICD-10-CM | POA: Insufficient documentation

## 2015-12-04 DIAGNOSIS — Z7984 Long term (current) use of oral hypoglycemic drugs: Secondary | ICD-10-CM | POA: Insufficient documentation

## 2015-12-04 DIAGNOSIS — F528 Other sexual dysfunction not due to a substance or known physiological condition: Secondary | ICD-10-CM | POA: Diagnosis not present

## 2015-12-04 DIAGNOSIS — M25532 Pain in left wrist: Secondary | ICD-10-CM | POA: Diagnosis not present

## 2015-12-04 DIAGNOSIS — Z79899 Other long term (current) drug therapy: Secondary | ICD-10-CM | POA: Diagnosis not present

## 2015-12-04 DIAGNOSIS — E785 Hyperlipidemia, unspecified: Secondary | ICD-10-CM

## 2015-12-04 DIAGNOSIS — N400 Enlarged prostate without lower urinary tract symptoms: Secondary | ICD-10-CM | POA: Insufficient documentation

## 2015-12-04 LAB — PSA: PSA: 0.9 ng/mL (ref <=4.0)

## 2015-12-04 MED ORDER — SILDENAFIL CITRATE 100 MG PO TABS: 100.0000 mg | ORAL_TABLET | Freq: Every day | ORAL | 3 refills | Status: DC | PRN

## 2015-12-04 MED ORDER — ROSUVASTATIN CALCIUM 20 MG PO TABS: 20.0000 mg | ORAL_TABLET | Freq: Every day | ORAL | 3 refills | Status: DC

## 2015-12-04 MED FILL — $VIAGRA 100 MG TABLET: 100 | 30 days supply | Qty: 10 | Fill #5

## 2015-12-04 NOTE — Progress Notes (Signed)
Patient is here for FU  Patient complains of left wrist pain from carpal tunnel surgery.  Patient has not taken medication today and patient has not eaten today.  Patient would like the flu shot today. Patient tolerated injection well today. Patient tolerated tdap injection today.   Patient denies suicidal ideations at this time.

## 2015-12-04 NOTE — Progress Notes (Signed)
Connor Murray, is a 65 y.o. male  NFA:[id number]:[id number]  ION:[id number]  DOB - 08/03/1950  Chief Complaint  Patient presents with  . Follow-up      Subjective:   Connor Murray is a 65 y.o. male history of type 2 diabetes mellitus, dyslipidemia and BPH here today for a follow up visit and medication refill. Only complaint today is ongoing wrist pain on the left, diagnosed in the past as Carpal tunnel syndrome status post surgery. Numbness and tingling has resolved after cervical discectomy. Patient is due for DTaP and flu vaccination. Patient claims compliant WITH his medications for blood sugar and blood pressure. Both are controlled. Patient has No headache, No chest pain, No abdominal pain - No Nausea, No new weakness tingling or numbness, No Cough - SOB.  ALLERGIES: No Known Allergies  PAST MEDICAL HISTORY: Past Medical History:  Diagnosis Date  . Diabetes mellitus without complication (HCC)     MEDICATIONS AT HOME: Prior to Admission medications   Medication Sig Start Date End Date Taking? Authorizing Provider  acetaminophen-codeine (TYLENOL #3) 300-30 MG tablet TAKE 1 TABLET BY MOUTH EVERY 4 HOURS AS NEEDED 06/17/15  Yes Quentin Angstlugbemiga E Terianna Peggs, MD  diclofenac sodium (VOLTAREN) 1 % GEL Apply 4 g topically 4 (four) times daily. 07/31/15  Yes Quentin Angstlugbemiga E Josslyn Ciolek, MD  doxazosin (CARDURA) 1 MG tablet Take 0.5 tablets (0.5 mg total) by mouth daily. 11/22/15  Yes Timika Muench Annitta NeedsE Reighlynn Swiney, MD  doxazosin (CARDURA) 2 MG tablet TAKE 1/2 TABLET BY MOUTH DAILY 11/26/15  Yes Joseandres Mazer Annitta NeedsE Jahari Billy, MD  gabapentin (NEURONTIN) 100 MG capsule Take 1 capsule (100 mg total) by mouth 3 (three) times daily. 11/22/15  Yes Domenic Schoenberger E Hyman HopesJegede, MD  gabapentin (NEURONTIN) 100 MG capsule TAKE 1 CAPSULE BY MOUTH 3 TIMES DAILY. 11/26/15  Yes Quentin Angstlugbemiga E Khan Chura, MD  metFORMIN (GLUCOPHAGE) 500 MG tablet Take 1 tablet (500 mg total) by mouth 2 (two) times daily with a meal. 11/22/15  Yes Tris Howell E Hyman HopesJegede, MD  rosuvastatin  (CRESTOR) 20 MG tablet Take 1 tablet (20 mg total) by mouth daily. 12/04/15  Yes Quentin Angstlugbemiga E Shacoria Latif, MD  sildenafil (VIAGRA) 100 MG tablet Take 1 tablet (100 mg total) by mouth daily as needed for erectile dysfunction. 12/04/15  Yes Quentin Angstlugbemiga E Michai Dieppa, MD    Objective:   Vitals:   12/04/15 1216  BP: 132/69  Pulse: (!) 57  Resp: 18  Temp: 98.4 F (36.9 C)  TempSrc: Oral  SpO2: 96%  Weight: 182 lb 9.6 oz (82.8 kg)  Height: 5\' 6"  (1.676 m)    Exam General appearance : Awake, alert, not in any distress. Speech Clear. Not toxic looking HEENT: Atraumatic and Normocephalic, pupils equally reactive to light and accomodation Neck: Supple, no JVD. No cervical lymphadenopathy.  Chest: Good air entry bilaterally, no added sounds  CVS: S1 S2 regular, no murmurs.  Abdomen: Bowel sounds present, Non tender and not distended with no gaurding, rigidity or rebound. Extremities: B/L Lower Ext shows no edema, both legs are warm to touch Neurology: Awake alert, and oriented X 3, CN II-XII intact, Non focal Skin: No Rash  Data Review Lab Results  Component Value Date   HGBA1C 5.9 06/20/2015   HGBA1C 6.90 03/21/2015   HGBA1C 6.50 08/20/2014   Assessment & Plan   1. Type 2 diabetes mellitus without complication, without long-term current use of insulin (HCC)  - Flu Vaccine QUAD 36+ mos PF IM (Fluarix & Fluzone Quad PF) - Glucose (CBG) - Ambulatory referral to Podiatry  Aim for 30 minutes of exercise most days. Rethink what you drink. Water is great! Aim for 2-3 Carb Choices per meal (30-45 grams) +/- 1 either way  Aim for 0-15 Carbs per snack if hungry  Include protein in moderation with your meals and snacks  Consider reading food labels for Total Carbohydrate and Fat Grams of foods  Consider checking BG at alternate times per day  Continue taking medication as directed Be mindful about how much sugar you are adding to beverages and other foods. Fruit Punch - find one with no sugar    Measure and decrease portions of carbohydrate foods  Make your plate and don't go back for seconds  2. Dyslipidemia  - rosuvastatin (CRESTOR) 20 MG tablet; Take 1 tablet (20 mg total) by mouth daily.  Dispense: 90 tablet; Refill: 3  To address this please limit saturated fat to no more than 7% of your calories, limit cholesterol to 200 mg/day, increase fiber and exercise as tolerated. If needed we may add another cholesterol lowering medication to your regimen.   3. BPH (benign prostatic hyperplasia)  - PSA  4. ERECTILE DYSFUNCTION  - sildenafil (VIAGRA) 100 MG tablet; Take 1 tablet (100 mg total) by mouth daily as needed for erectile dysfunction.  Dispense: 30 tablet; Refill: 3  Patient have been counseled extensively about nutrition and exercise  Return in about 3 months (around 03/05/2016) for Hemoglobin A1C and Follow up, DM, Routine Follow Up.  The patient was given clear instructions to go to ER or return to medical center if symptoms don't improve, worsen or new problems develop. The patient verbalized understanding. The patient was told to call to get lab results if they haven't heard anything in the next week.   This note has been created with Education officer, environmentalDragon speech recognition software and smart phrase technology. Any transcriptional errors are unintentional.   Jeanann LewandowskyJEGEDE, Freman Lapage, MD, MHA, FACP, FAAP, CPE [facility] and [facility] [place], [place] [phone]   12/04/2015, 12:50 PM

## 2015-12-04 NOTE — Patient Instructions (Signed)
Basic Carbohydrate Counting for Diabetes Mellitus Carbohydrate counting is a method for keeping track of the amount of carbohydrates you eat. Eating carbohydrates naturally increases the level of sugar (glucose) in your blood, so it is important for you to know the amount that is okay for you to have in every meal. Carbohydrate counting helps keep the level of glucose in your blood within normal limits. The amount of carbohydrates allowed is different for every person. A dietitian can help you calculate the amount that is right for you. Once you know the amount of carbohydrates you can have, you can count the carbohydrates in the foods you want to eat. Carbohydrates are found in the following foods:  Grains, such as breads and cereals.  Dried beans and soy products.  Starchy vegetables, such as potatoes, peas, and corn.  Fruit and fruit juices.  Milk and yogurt.  Sweets and snack foods, such as cake, cookies, candy, chips, soft drinks, and fruit drinks. CARBOHYDRATE COUNTING There are two ways to count the carbohydrates in your food. You can use either of the methods or a combination of both. Reading the "Nutrition Facts" on Packaged Food The "Nutrition Facts" is an area that is included on the labels of almost all packaged food and beverages in the [place]. It includes the serving size of that food or beverage and information about the nutrients in each serving of the food, including the grams (g) of carbohydrate per serving.  Decide the number of servings of this food or beverage that you will be able to eat or drink. Multiply that number of servings by the number of grams of carbohydrate that is listed on the label for that serving. The total will be the amount of carbohydrates you will be having when you eat or drink this food or beverage. Learning Standard Serving Sizes of Food When you eat food that is not packaged or does not include "Nutrition Facts" on the label, you need to  measure the servings in order to count the amount of carbohydrates.A serving of most carbohydrate-rich foods contains about 15 g of carbohydrates. The following list includes serving sizes of carbohydrate-rich foods that provide 15 g ofcarbohydrate per serving:   1 slice of bread (1 oz) or 1 six-inch tortilla.    of a hamburger bun or English muffin.  4-6 crackers.   cup unsweetened dry cereal.    cup hot cereal.   cup rice or pasta.    cup mashed potatoes or  of a large baked potato.  1 cup fresh fruit or one small piece of fruit.    cup canned or frozen fruit or fruit juice.  1 cup milk.   cup plain fat-free yogurt or yogurt sweetened with artificial sweeteners.   cup cooked dried beans or starchy vegetable, such as peas, corn, or potatoes.  Decide the number of standard-size servings that you will eat. Multiply that number of servings by 15 (the grams of carbohydrates in that serving). For example, if you eat 2 cups of strawberries, you will have eaten 2 servings and 30 g of carbohydrates (2 servings x 15 g = 30 g). For foods such as soups and casseroles, in which more than one food is mixed in, you will need to count the carbohydrates in each food that is included. EXAMPLE OF CARBOHYDRATE COUNTING Sample Dinner  3 oz chicken breast.   cup of brown rice.   cup of corn.  1 cup milk.   1 cup strawberries with   sugar-free whipped topping.  Carbohydrate Calculation Step 1: Identify the foods that contain carbohydrates:   Rice.   Corn.   Milk.   Strawberries. Step 2:Calculate the number of servings eaten of each:   2 servings of rice.   1 serving of corn.   1 serving of milk.   1 serving of strawberries. Step 3: Multiply each of those number of servings by 15 g:   2 servings of rice x 15 g = 30 g.   1 serving of corn x 15 g = 15 g.   1 serving of milk x 15 g = 15 g.   1 serving of strawberries x 15 g = 15 g. Step 4: Add  together all of the amounts to find the total grams of carbohydrates eaten: 30 g + 15 g + 15 g + 15 g = 75 g.   This information is not intended to replace advice given to you by your health care provider. Make sure you discuss any questions you have with your health care provider.   Document Released: 03/30/2005 Document Revised: 04/20/2014 Document Reviewed: 02/24/2013 Elsevier Interactive Patient Education 2016 Elsevier Inc. Dyslipidemia Dyslipidemia is an imbalance of the lipids in your blood. Lipids are waxy, fat-like proteins that your body needs in small amounts. Dyslipidemia often involves the lipids cholesterol or triglycerides. Common forms of dyslipidemia are:  High levels of bad cholesterol (LDL cholesterol). LDL cholesterol is the type of cholesterol that causes heart disease.  Low levels of good cholesterol (HDL cholesterol). HDL cholesterol is the type of cholesterol that helps protect against heart disease.  High levels of triglycerides. Triglycerides are a fatty substance in the blood linked to a buildup of plaque on your arteries. RISK FACTORS  Increased age.  Having a family history of high cholesterol.  Certain medicines, including birth control pills, diuretics, beta-blockers, and some medicines for depression.  Smoking.  Eating a high-fat diet.  Being overweight.  Medical conditions such as diabetes, polycystic ovary syndrome, pregnancy, kidney disease, and hypothyroidism.  Lack of regular exercise. SIGNS AND SYMPTOMS There are no signs or symptoms with dyslipidemia. DIAGNOSIS A simple blood test called a fasting blood test can be done to determine your level of:  Total cholesterol. This is the combined number of LDL cholesterol and HDL cholesterol. A healthy number is lower than 200.  LDL cholesterol. The goal number for LDL cholesterol is different for each person depending on risk factors. Ask your health care provider what your LDL cholesterol number  should be.  HDL cholesterol. A healthy level of HDL cholesterol is 60 or higher. A number lower than 40 for men or 50 for women is a danger sign.  Triglycerides. A healthy triglyceride number is less than 150. TREATMENT Dyslipidemia is a treatable condition. Your health care provider will advise you on what type of treatment is best based on your age, your test results, and current guidelines. Treatment may include:  Dietary changes. A dietitian may help you create a diet that is based on your risk factors, conditions, and lifestyle.  Regular exercise. This can help lower your LDL cholesterol, raise your HDL cholesterol, and help with weight management. Check with your health care provider before beginning an exercise program. Most people should participate in 30 minutes of brisk exercise 5 days a week.  Quitting smoking.  Medicines to lower LDL cholesterol and triglycerides.  If you have high levels of triglycerides, your health care provider may:  Have you stop drinking alcohol.  Have you restrict your fat intake.  Have you eliminate refined sugars from your diet.  Treat you for other conditions, such as underactive thyroid gland (hypothyroidism) and high blood sugar (hyperglycemia). Your health care provider will monitor your lipid levels with regular blood tests. HOME CARE INSTRUCTIONS  Eat a healthy diet. Follow any diet instructions if they were given to you by your health care provider.  Maintain a healthy weight.  Exercise regularly based on the recommendations of your health care provider.  Do not use any tobacco products, including cigarettes, chewing tobacco, or electronic cigarettes.  Take medicines only as directed by your health care provider.  Keep all follow-up visits as directed by your health care provider. SEEK MEDICAL CARE IF: You are having possible side effects from your medicines.   This information is not intended to replace advice given to you by  your health care provider. Make sure you discuss any questions you have with your health care provider.   Document Released: 04/04/2013 Document Revised: 04/20/2014 Document Reviewed: 04/04/2013 Elsevier Interactive Patient Education [employer] Elsevier Inc.

## 2015-12-20 ENCOUNTER — Telehealth: Payer: Self-pay | Admitting: *Deleted

## 2015-12-20 NOTE — Telephone Encounter (Signed)
Patient verified DOB Patient is aware of prostate enzyme levels being normal. Patient expressed his understanding and had no further questions at this time.

## 2015-12-20 NOTE — Telephone Encounter (Signed)
-----   Message from Quentin Angstlugbemiga E Jegede, MD sent at 12/09/2015  5:05 PM EDT ----- Please inform patient that his prostate enzyme level is within normal limit.

## 2015-12-25 ENCOUNTER — Ambulatory Visit: Payer: Self-pay | Admitting: Podiatry

## 2016-01-01 ENCOUNTER — Telehealth: Payer: Self-pay | Admitting: Internal Medicine

## 2016-01-01 MED FILL — DOXAZOSIN MESYLATE 2 MG TAB: 2 | 30 days supply | Qty: 15 | Fill #1

## 2016-01-01 MED FILL — GABAPENTIN 100 MG CAPSULE: 100 | 30 days supply | Qty: 90 | Fill #0

## 2016-01-01 MED FILL — $VIAGRA 100 MG TABLET: 100 | 30 days supply | Qty: 10 | Fill #0

## 2016-01-01 MED FILL — ROSUVASTATIN CALCIUM 20 MG: 20 | 90 days supply | Qty: 90 | Fill #0

## 2016-01-01 NOTE — Telephone Encounter (Signed)
Pt came into office states he has seen on tv and has researched that metformin is causing amputation of toes and pt is concerned and states he will not be taking medication pt  is asking for alternative medication. Please f/up

## 2016-01-01 NOTE — Telephone Encounter (Signed)
Patient is refusing to take Metformin due to commercial stating amputations are listed as a concern for individuals taking this medication. Please advise for an alternative or advice for patient.

## 2016-01-02 NOTE — Telephone Encounter (Signed)
Patient can hold off until next appointment then we can discuss option

## 2016-01-03 NOTE — Telephone Encounter (Signed)
Patient verified DOB Patient is aware of PCP stating patient may continue not taking the metformin and at this FU visit, patient and PCP will discuss concern and alternatives. No further questions at this time.

## 2016-01-23 ENCOUNTER — Encounter: Payer: Self-pay | Admitting: Internal Medicine

## 2016-01-23 ENCOUNTER — Ambulatory Visit: Payer: Commercial Managed Care - HMO | Attending: Internal Medicine | Admitting: Internal Medicine

## 2016-01-23 ENCOUNTER — Ambulatory Visit: Payer: Commercial Managed Care - HMO | Admitting: Internal Medicine

## 2016-01-23 VITALS — BP 130/71 | HR 66 | Temp 98.2°F | Resp 18 | Ht 66.0 in | Wt 185.0 lb

## 2016-01-23 DIAGNOSIS — Z7984 Long term (current) use of oral hypoglycemic drugs: Secondary | ICD-10-CM | POA: Diagnosis not present

## 2016-01-23 DIAGNOSIS — E785 Hyperlipidemia, unspecified: Secondary | ICD-10-CM

## 2016-01-23 DIAGNOSIS — N400 Enlarged prostate without lower urinary tract symptoms: Secondary | ICD-10-CM | POA: Diagnosis not present

## 2016-01-23 DIAGNOSIS — R202 Paresthesia of skin: Secondary | ICD-10-CM

## 2016-01-23 DIAGNOSIS — E119 Type 2 diabetes mellitus without complications: Secondary | ICD-10-CM | POA: Diagnosis not present

## 2016-01-23 LAB — POCT GLYCOSYLATED HEMOGLOBIN (HGB A1C): HEMOGLOBIN A1C: 6.5

## 2016-01-23 LAB — GLUCOSE, POCT (MANUAL RESULT ENTRY): POC Glucose: 89 mg/dl (ref 70–99)

## 2016-01-23 MED ORDER — ROSUVASTATIN CALCIUM 20 MG PO TABS
20.0000 mg | ORAL_TABLET | Freq: Every day | ORAL | 3 refills | Status: DC
Start: 2016-01-23 — End: 2016-06-17

## 2016-01-23 MED ORDER — GABAPENTIN 100 MG PO CAPS: 100.0000 mg | ORAL_CAPSULE | Freq: Three times a day (TID) | ORAL | 3 refills | Status: DC

## 2016-01-23 MED ORDER — METFORMIN HCL 500 MG PO TABS: 500.0000 mg | ORAL_TABLET | Freq: Two times a day (BID) | ORAL | 3 refills | Status: DC

## 2016-01-23 MED FILL — metFORMIN HCL 500 MG TABS: 500 | 30 days supply | Qty: 60 | Fill #9

## 2016-01-23 NOTE — Patient Instructions (Signed)
Diabetes and Foot Care Diabetes may cause you to have problems because of poor blood supply (circulation) to your feet and legs. This may cause the skin on your feet to become thinner, break easier, and heal more slowly. Your skin may become dry, and the skin may peel and crack. You may also have nerve damage in your legs and feet causing decreased feeling in them. You may not notice minor injuries to your feet that could lead to infections or more serious problems. Taking care of your feet is one of the most important things you can do for yourself.  HOME CARE INSTRUCTIONS  Wear shoes at all times, even in the house. Do not go barefoot. Bare feet are easily injured.  Check your feet daily for blisters, cuts, and redness. If you cannot see the bottom of your feet, use a mirror or ask someone for help.  Wash your feet with warm water (do not use hot water) and mild soap. Then pat your feet and the areas between your toes until they are completely dry. Do not soak your feet as this can dry your skin.  Apply a moisturizing lotion or petroleum jelly (that does not contain alcohol and is unscented) to the skin on your feet and to dry, brittle toenails. Do not apply lotion between your toes.  Trim your toenails straight across. Do not dig under them or around the cuticle. File the edges of your nails with an emery board or nail file.  Do not cut corns or calluses or try to remove them with medicine.  Wear clean socks or stockings every day. Make sure they are not too tight. Do not wear knee-high stockings since they may decrease blood flow to your legs.  Wear shoes that fit properly and have enough cushioning. To break in new shoes, wear them for just a few hours a day. This prevents you from injuring your feet. Always look in your shoes before you put them on to be sure there are no objects inside.  Do not cross your legs. This may decrease the blood flow to your feet.  If you find a minor scrape,  cut, or break in the skin on your feet, keep it and the skin around it clean and dry. These areas may be cleansed with mild soap and water. Do not cleanse the area with peroxide, alcohol, or iodine.  When you remove an adhesive bandage, be sure not to damage the skin around it.  If you have a wound, look at it several times a day to make sure it is healing.  Do not use heating pads or hot water bottles. They may burn your skin. If you have lost feeling in your feet or legs, you may not know it is happening until it is too late.  Make sure your health care provider performs a complete foot exam at least annually or more often if you have foot problems. Report any cuts, sores, or bruises to your health care provider immediately. SEEK MEDICAL CARE IF:   You have an injury that is not healing.  You have cuts or breaks in the skin.  You have an ingrown nail.  You notice redness on your legs or feet.  You feel burning or tingling in your legs or feet.  You have pain or cramps in your legs and feet.  Your legs or feet are numb.  Your feet always feel cold. SEEK IMMEDIATE MEDICAL CARE IF:   There is increasing redness,   swelling, or pain in or around a wound.  There is a red line that goes up your leg.  Pus is coming from a wound.  You develop a fever or as directed by your health care provider.  You notice a bad smell coming from an ulcer or wound.   This information is not intended to replace advice given to you by your health care provider. Make sure you discuss any questions you have with your health care provider.   Document Released: 03/27/2000 Document Revised: 11/30/2012 Document Reviewed: 09/06/2012 Elsevier Interactive Patient Education 2016 [employer] Basic Carbohydrate Counting for Diabetes Mellitus Carbohydrate counting is a method for keeping track of the amount of carbohydrates you eat. Eating carbohydrates naturally increases the level of sugar (glucose) in your  blood, so it is important for you to know the amount that is okay for you to have in every meal. Carbohydrate counting helps keep the level of glucose in your blood within normal limits. The amount of carbohydrates allowed is different for every person. A dietitian can help you calculate the amount that is right for you. Once you know the amount of carbohydrates you can have, you can count the carbohydrates in the foods you want to eat. Carbohydrates are found in the following foods:  Grains, such as breads and cereals.  Dried beans and soy products.  Starchy vegetables, such as potatoes, peas, and corn.  Fruit and fruit juices.  Milk and yogurt.  Sweets and snack foods, such as cake, cookies, candy, chips, soft drinks, and fruit drinks. CARBOHYDRATE COUNTING There are two ways to count the carbohydrates in your food. You can use either of the methods or a combination of both. Reading the "Nutrition Facts" on Packaged Food The "Nutrition Facts" is an area that is included on the labels of almost all packaged food and beverages in the [place]. It includes the serving size of that food or beverage and information about the nutrients in each serving of the food, including the grams (g) of carbohydrate per serving.  Decide the number of servings of this food or beverage that you will be able to eat or drink. Multiply that number of servings by the number of grams of carbohydrate that is listed on the label for that serving. The total will be the amount of carbohydrates you will be having when you eat or drink this food or beverage. Learning Standard Serving Sizes of Food When you eat food that is not packaged or does not include "Nutrition Facts" on the label, you need to measure the servings in order to count the amount of carbohydrates.A serving of most carbohydrate-rich foods contains about 15 g of carbohydrates. The following list includes serving sizes of carbohydrate-rich foods that  provide 15 g ofcarbohydrate per serving:   1 slice of bread (1 oz) or 1 six-inch tortilla.    of a hamburger bun or English muffin.  4-6 crackers.   cup unsweetened dry cereal.    cup hot cereal.   cup rice or pasta.    cup mashed potatoes or  of a large baked potato.  1 cup fresh fruit or one small piece of fruit.    cup canned or frozen fruit or fruit juice.  1 cup milk.   cup plain fat-free yogurt or yogurt sweetened with artificial sweeteners.   cup cooked dried beans or starchy vegetable, such as peas, corn, or potatoes.  Decide the number of standard-size servings that you will eat. Multiply that   number of servings by 15 (the grams of carbohydrates in that serving). For example, if you eat 2 cups of strawberries, you will have eaten 2 servings and 30 g of carbohydrates (2 servings x 15 g = 30 g). For foods such as soups and casseroles, in which more than one food is mixed in, you will need to count the carbohydrates in each food that is included. EXAMPLE OF CARBOHYDRATE COUNTING Sample Dinner  3 oz chicken breast.   cup of brown rice.   cup of corn.  1 cup milk.   1 cup strawberries with sugar-free whipped topping.  Carbohydrate Calculation Step 1: Identify the foods that contain carbohydrates:   Rice.   Corn.   Milk.   Strawberries. Step 2:Calculate the number of servings eaten of each:   2 servings of rice.   1 serving of corn.   1 serving of milk.   1 serving of strawberries. Step 3: Multiply each of those number of servings by 15 g:   2 servings of rice x 15 g = 30 g.   1 serving of corn x 15 g = 15 g.   1 serving of milk x 15 g = 15 g.   1 serving of strawberries x 15 g = 15 g. Step 4: Add together all of the amounts to find the total grams of carbohydrates eaten: 30 g + 15 g + 15 g + 15 g = 75 g.   This information is not intended to replace advice given to you by your health care provider. Make sure you  discuss any questions you have with your health care provider.   Document Released: 03/30/2005 Document Revised: 04/20/2014 Document Reviewed: 02/24/2013 Elsevier Interactive Patient Education 2016 Elsevier Inc.  

## 2016-01-23 NOTE — Progress Notes (Signed)
Connor Murray, is a 65 y.o. male  [id number]  [id number]  DOB - 02/20/1951  No chief complaint on file.    Subjective:   Connor Murray is a 65 y.o. male with history of type 2 diabetes mellitus, dyslipidemia and BPH here today for a follow up visit and medication refill. Patient has not been taking his metformin because he thinks it's going to make him lose his leg one day per information he got from goggle search and TV advert. He has no major complaint today, wants to know if what he heard about metformin is true and if he should completely stop or restart. He is adherent with other medications and trying dietary control as well. Patient has No headache, No chest pain, No abdominal pain - No Nausea, No new weakness tingling or numbness, No Cough - SOB.  No problems updated.  ALLERGIES: No Known Allergies  PAST MEDICAL HISTORY: Past Medical History:  Diagnosis Date  . Diabetes mellitus without complication (HCC)     MEDICATIONS AT HOME: Prior to Admission medications   Medication Sig Start Date End Date Taking? Authorizing Provider  acetaminophen-codeine (TYLENOL #3) 300-30 MG tablet TAKE 1 TABLET BY MOUTH EVERY 4 HOURS AS NEEDED 06/17/15   Quentin Angstlugbemiga E Magdelene Ruark, MD  diclofenac sodium (VOLTAREN) 1 % GEL Apply 4 g topically 4 (four) times daily. 07/31/15   Quentin Angstlugbemiga E Terriana Barreras, MD  doxazosin (CARDURA) 1 MG tablet Take 0.5 tablets (0.5 mg total) by mouth daily. 11/22/15   Quentin Angstlugbemiga E Jahmai Finelli, MD  doxazosin (CARDURA) 2 MG tablet TAKE 1/2 TABLET BY MOUTH DAILY 11/26/15   Quentin Angstlugbemiga E Cailie Bosshart, MD  gabapentin (NEURONTIN) 100 MG capsule Take 1 capsule (100 mg total) by mouth 3 (three) times daily. 01/23/16   Quentin Angstlugbemiga E Nelissa Bolduc, MD  metFORMIN (GLUCOPHAGE) 500 MG tablet Take 1 tablet (500 mg total) by mouth 2 (two) times daily with a meal. 01/23/16   Quentin Angstlugbemiga E Mahamed Zalewski, MD  rosuvastatin (CRESTOR) 20 MG tablet Take 1 tablet (20 mg total) by mouth daily. 01/23/16   Quentin Angstlugbemiga E Jeff Frieden, MD    sildenafil (VIAGRA) 100 MG tablet Take 1 tablet (100 mg total) by mouth daily as needed for erectile dysfunction. 12/04/15   Quentin Angstlugbemiga E Carah Barrientes, MD    Objective:  There were no vitals filed for this visit. Exam General appearance : Awake, alert, not in any distress. Speech Clear. Not toxic looking HEENT: Atraumatic and Normocephalic, pupils equally reactive to light and accomodation Neck: Supple, no JVD. No cervical lymphadenopathy.  Chest: Good air entry bilaterally, no added sounds  CVS: S1 S2 regular, no murmurs.  Abdomen: Bowel sounds present, Non tender and not distended with no gaurding, rigidity or rebound. Extremities: B/L Lower Ext shows no edema, both legs are warm to touch Neurology: Awake alert, and oriented X 3, CN II-XII intact, Non focal Skin: No Rash  Data Review Lab Results  Component Value Date   HGBA1C 5.9 06/20/2015   HGBA1C 6.90 03/21/2015   HGBA1C 6.50 08/20/2014    Assessment & Plan   1. Type 2 diabetes mellitus without complication, without long-term current use of insulin (HCC)  - POCT A1C - Glucose (CBG) Restart - metFORMIN (GLUCOPHAGE) 500 MG tablet; Take 1 tablet (500 mg total) by mouth 2 (two) times daily with a meal.  Dispense: 180 tablet; Refill: 3  Aim for 30 minutes of exercise most days. Rethink what you drink. Water is great! Aim for 2-3 Carb Choices per meal (30-45 grams) +/- 1 either way  Aim for 0-15 Carbs per snack if hungry  Include protein in moderation with your meals and snacks  Consider reading food labels for Total Carbohydrate and Fat Grams of foods  Consider checking BG at alternate times per day  Continue taking medication as directed Be mindful about how much sugar you are adding to beverages and other foods. Fruit Punch - find one with no sugar  Measure and decrease portions of carbohydrate foods  Make your plate and don't go back for seconds  2. Dyslipidemia  - rosuvastatin (CRESTOR) 20 MG tablet; Take 1 tablet (20  mg total) by mouth daily.  Dispense: 90 tablet; Refill: 3  To address this please limit saturated fat to no more than 7% of your calories, limit cholesterol to 200 mg/day, increase fiber and exercise as tolerated. If needed we may add another cholesterol lowering medication to your regimen.   3. Left hand paresthesia  - gabapentin (NEURONTIN) 100 MG capsule; Take 1 capsule (100 mg total) by mouth 3 (three) times daily.  Dispense: 270 capsule; Refill: 3  Patient have been counseled extensively about nutrition and exercise. Other issues discussed during this visit include: low cholesterol diet, weight control and daily exercise, foot care, annual eye examinations at Ophthalmology, importance of adherence with medications and regular follow-up. We also discussed long term complications of uncontrolled diabetes and hypertension.   Return in about 6 months (around 07/23/2016) for Hemoglobin A1C and Follow up, DM, Annual Physical.  The patient was given clear instructions to go to ER or return to medical center if symptoms don't improve, worsen or new problems develop. The patient verbalized understanding. The patient was told to call to get lab results if they haven't heard anything in the next week.   This note has been created with Education officer, environmental. Any transcriptional errors are unintentional.    Jeanann Lewandowsky, MD, MHA, FACP, FAAP, CPE [facility] [place], [place] [phone]   01/23/2016, 3:26 PM

## 2016-01-23 NOTE — Progress Notes (Signed)
Patient is here for medication advice  Patient complains of left hand pain at surgery site.  Patient has not taken medication today. Patient has eaten today.

## 2016-02-04 MED FILL — $VIAGRA 100 MG TABLET: 100 | 30 days supply | Qty: 10 | Fill #1

## 2016-03-09 MED FILL — $VIAGRA 100 MG TABLET: 100 | 30 days supply | Qty: 10 | Fill #2

## 2016-03-23 ENCOUNTER — Other Ambulatory Visit: Payer: Self-pay | Admitting: Internal Medicine

## 2016-03-27 ENCOUNTER — Other Ambulatory Visit: Payer: Self-pay | Admitting: Internal Medicine

## 2016-03-27 DIAGNOSIS — R202 Paresthesia of skin: Secondary | ICD-10-CM

## 2016-04-01 ENCOUNTER — Other Ambulatory Visit: Payer: Self-pay | Admitting: Internal Medicine

## 2016-04-01 DIAGNOSIS — R202 Paresthesia of skin: Secondary | ICD-10-CM

## 2016-04-02 ENCOUNTER — Telehealth: Payer: Self-pay | Admitting: Internal Medicine

## 2016-04-02 NOTE — Telephone Encounter (Signed)
[employer] has already sent the request via faxed. It will be mailed back.

## 2016-04-02 NOTE — Telephone Encounter (Signed)
Pt came in requesting approval from PCP for pts insurance, [employer] to deliver his medication, Gabapentin, to his house  Pt states [employer] needs approval from PCP faxed to [phone]

## 2016-04-08 ENCOUNTER — Ambulatory Visit: Payer: Commercial Managed Care - HMO | Attending: Internal Medicine | Admitting: *Deleted

## 2016-04-08 ENCOUNTER — Other Ambulatory Visit: Payer: Self-pay | Admitting: Internal Medicine

## 2016-04-08 DIAGNOSIS — E119 Type 2 diabetes mellitus without complications: Secondary | ICD-10-CM

## 2016-04-08 DIAGNOSIS — Z23 Encounter for immunization: Secondary | ICD-10-CM

## 2016-04-08 MED FILL — $VIAGRA 100 MG TABLET: 100 | 30 days supply | Qty: 10 | Fill #3

## 2016-04-08 NOTE — Progress Notes (Signed)
   Connor Murray presents for immunizations.    Screening questions for immunizations: 1. Are you sick today?  no 2. Do you have allergies to medications, foods, or any vaccines?  no 3. Have you ever had a serious reaction after receiving a vaccination?  no 4. Do you have a long-term health problem with heart disease, asthma, lung disease, kidney disease, metabolic disease (e.g. diabetes), anemia, or other blood disorder?  Yes: Diabetes 5. Have you had a seizure, brain problem, or other nervous system problem?  no 6. Do you have cancer, leukemia, AIDS, or any other immune system problem?  no 7. Do you take cortisone, prednisone, other steroids, anticancer drugs or have you had radiation treatments?  no 8. Have you received a transfusion of blood or blood products, or been given immune (gamma) globulin or an antiviral drug in the past year?  no 9. Have you received vaccinations in the past 4 weeks?  no 10. FEMALES ONLY: Are you pregnant or is there a chance you could become pregnant during the next month?  no

## 2016-05-06 MED FILL — $VIAGRA 100 MG TABLET: 100 | 30 days supply | Qty: 10 | Fill #4

## 2016-05-08 ENCOUNTER — Ambulatory Visit: Payer: Commercial Managed Care - HMO | Attending: Internal Medicine

## 2016-05-11 ENCOUNTER — Other Ambulatory Visit: Payer: Self-pay | Admitting: *Deleted

## 2016-05-11 DIAGNOSIS — F528 Other sexual dysfunction not due to a substance or known physiological condition: Secondary | ICD-10-CM

## 2016-05-11 MED ORDER — SILDENAFIL CITRATE 100 MG PO TABS: 100.0000 mg | ORAL_TABLET | Freq: Every day | ORAL | 3 refills | Status: DC | PRN

## 2016-05-11 NOTE — Telephone Encounter (Signed)
PRINTED FOR PASS PROGRAM 

## 2016-05-18 ENCOUNTER — Other Ambulatory Visit: Payer: Self-pay | Admitting: Internal Medicine

## 2016-05-18 DIAGNOSIS — E119 Type 2 diabetes mellitus without complications: Secondary | ICD-10-CM

## 2016-05-26 ENCOUNTER — Telehealth: Payer: Self-pay | Admitting: Internal Medicine

## 2016-05-26 DIAGNOSIS — E119 Type 2 diabetes mellitus without complications: Secondary | ICD-10-CM

## 2016-05-26 MED ORDER — METFORMIN HCL 500 MG PO TABS: 500.0000 mg | ORAL_TABLET | Freq: Two times a day (BID) | ORAL | 3 refills | Status: DC

## 2016-05-26 NOTE — Telephone Encounter (Signed)
Patient called requesting a refill for his Metformin. States that his is running out. Please f/u.

## 2016-05-26 NOTE — Telephone Encounter (Signed)
Metformin has been refilled.

## 2016-05-28 DIAGNOSIS — G5602 Carpal tunnel syndrome, left upper limb: Secondary | ICD-10-CM | POA: Diagnosis not present

## 2016-05-28 DIAGNOSIS — R202 Paresthesia of skin: Secondary | ICD-10-CM | POA: Diagnosis not present

## 2016-06-03 ENCOUNTER — Telehealth: Payer: Self-pay | Admitting: Internal Medicine

## 2016-06-03 ENCOUNTER — Encounter (INDEPENDENT_AMBULATORY_CARE_PROVIDER_SITE_OTHER): Payer: Self-pay

## 2016-06-03 NOTE — Telephone Encounter (Signed)
Pt presents requesting the date of a surgery that he had for a torn rotator cuff and asked if you can contact him. Please f/u.

## 2016-06-04 NOTE — Telephone Encounter (Signed)
Pt. Came into facility to drop off a [employer] letter for his PCP. Pt. Would like for his PCP to have it and pt. Was told to call 06/08/16 to schedule an appt. Letter will be put in PCP box.

## 2016-06-05 ENCOUNTER — Encounter: Payer: Self-pay | Admitting: Internal Medicine

## 2016-06-08 MED FILL — $VIAGRA 100 MG TABLET: 100 | 30 days supply | Qty: 10 | Fill #5

## 2016-06-16 DIAGNOSIS — G5602 Carpal tunnel syndrome, left upper limb: Secondary | ICD-10-CM | POA: Diagnosis not present

## 2016-06-16 DIAGNOSIS — M79602 Pain in left arm: Secondary | ICD-10-CM | POA: Diagnosis not present

## 2016-06-17 ENCOUNTER — Ambulatory Visit: Payer: Medicare HMO | Attending: Internal Medicine | Admitting: Internal Medicine

## 2016-06-17 ENCOUNTER — Encounter: Payer: Self-pay | Admitting: Internal Medicine

## 2016-06-17 VITALS — BP 132/75 | HR 64 | Temp 98.5°F | Resp 18 | Ht 66.0 in | Wt 183.0 lb

## 2016-06-17 DIAGNOSIS — E785 Hyperlipidemia, unspecified: Secondary | ICD-10-CM | POA: Insufficient documentation

## 2016-06-17 DIAGNOSIS — N529 Male erectile dysfunction, unspecified: Secondary | ICD-10-CM | POA: Insufficient documentation

## 2016-06-17 DIAGNOSIS — M79642 Pain in left hand: Secondary | ICD-10-CM | POA: Insufficient documentation

## 2016-06-17 DIAGNOSIS — R350 Frequency of micturition: Secondary | ICD-10-CM | POA: Diagnosis not present

## 2016-06-17 DIAGNOSIS — R202 Paresthesia of skin: Secondary | ICD-10-CM

## 2016-06-17 DIAGNOSIS — Z7984 Long term (current) use of oral hypoglycemic drugs: Secondary | ICD-10-CM | POA: Diagnosis not present

## 2016-06-17 DIAGNOSIS — E119 Type 2 diabetes mellitus without complications: Secondary | ICD-10-CM

## 2016-06-17 DIAGNOSIS — N400 Enlarged prostate without lower urinary tract symptoms: Secondary | ICD-10-CM | POA: Insufficient documentation

## 2016-06-17 DIAGNOSIS — R531 Weakness: Secondary | ICD-10-CM | POA: Diagnosis not present

## 2016-06-17 DIAGNOSIS — F528 Other sexual dysfunction not due to a substance or known physiological condition: Secondary | ICD-10-CM | POA: Diagnosis not present

## 2016-06-17 DIAGNOSIS — N401 Enlarged prostate with lower urinary tract symptoms: Secondary | ICD-10-CM | POA: Diagnosis not present

## 2016-06-17 LAB — COMPLETE METABOLIC PANEL WITH GFR
ALBUMIN: 4.7 g/dL (ref 3.6–5.1)
ALK PHOS: 57 U/L (ref 40–115)
ALT: 18 U/L (ref 9–46)
AST: 23 U/L (ref 10–35)
BILIRUBIN TOTAL: 0.7 mg/dL (ref 0.2–1.2)
BUN: 15 mg/dL (ref 7–25)
CO2: 27 mmol/L (ref 20–31)
CREATININE: 0.95 mg/dL (ref 0.70–1.25)
Calcium: 10.2 mg/dL (ref 8.6–10.3)
Chloride: 102 mmol/L (ref 98–110)
GFR, Est Non African American: 83 mL/min (ref >=60)
Glucose, Bld: 101 mg/dL — ABNORMAL HIGH (ref 65–99)
Potassium: 4.7 mmol/L (ref 3.5–5.3)
Sodium: 141 mmol/L (ref 135–146)
TOTAL PROTEIN: 9.1 g/dL — AB (ref 6.1–8.1)

## 2016-06-17 LAB — LIPID PANEL
Cholesterol: 170 mg/dL (ref <200)
HDL: 41 mg/dL (ref >40)
LDL CALC: 104 mg/dL — AB (ref <100)
TRIGLYCERIDES: 126 mg/dL (ref <150)
Total CHOL/HDL Ratio: 4.1 Ratio (ref <5.0)
VLDL: 25 mg/dL (ref <30)

## 2016-06-17 LAB — GLUCOSE, POCT (MANUAL RESULT ENTRY): POC Glucose: 96 mg/dl (ref 70–99)

## 2016-06-17 LAB — POCT GLYCOSYLATED HEMOGLOBIN (HGB A1C): Hemoglobin A1C: 6.6

## 2016-06-17 MED ORDER — ROSUVASTATIN CALCIUM 20 MG PO TABS: 20.0000 mg | ORAL_TABLET | Freq: Every day | ORAL | 3 refills | Status: DC

## 2016-06-17 MED ORDER — SILDENAFIL CITRATE 100 MG PO TABS: 100.0000 mg | ORAL_TABLET | Freq: Every day | ORAL | 3 refills | Status: DC | PRN

## 2016-06-17 MED ORDER — DOXAZOSIN MESYLATE 2 MG PO TABS: 1.0000 mg | ORAL_TABLET | Freq: Every day | ORAL | 2 refills | Status: DC

## 2016-06-17 MED ORDER — ACETAMINOPHEN-CODEINE #3 300-30 MG PO TABS: 1.0000 | ORAL_TABLET | ORAL | 0 refills | Status: DC | PRN

## 2016-06-17 MED ORDER — METFORMIN HCL 500 MG PO TABS: 500.0000 mg | ORAL_TABLET | Freq: Two times a day (BID) | ORAL | 3 refills | Status: DC

## 2016-06-17 MED ORDER — OMEGA-3-ACID ETHYL ESTERS 1 G PO CAPS: 1.0000 g | ORAL_CAPSULE | Freq: Two times a day (BID) | ORAL | 3 refills | Status: DC

## 2016-06-17 MED ORDER — DICLOFENAC SODIUM 1 % TD GEL: 4.0000 g | Freq: Four times a day (QID) | TRANSDERMAL | 2 refills | Status: DC

## 2016-06-17 MED ORDER — GABAPENTIN 100 MG PO CAPS: ORAL_CAPSULE | ORAL | 0 refills | Status: DC

## 2016-06-17 NOTE — Progress Notes (Signed)
Patient is here for DM FU  Patient denies pain at this time.  Patient has not taken medication today. Patient has not eaten today.

## 2016-06-17 NOTE — Progress Notes (Signed)
Subjective:  Patient ID: Connor Murray, male    DOB: 01/24/1951  Age: 66 y.o. MRN: [id number]  CC: Diabetes   HPI Connor Murray presents for f/u for chronic medical conditions including dm type 2, dyslipidemia, erectile dysfunction, hand pain, and BPH. He arrives today for f/u and med refill.   Patient presents for f/u for diabetes mellitus, type 2. Does not check sugar at home. Currently just on metformin. He denies symptoms of hypogycemia, blurry vision, foot ulcerations, nausea, polydipsia, and polyuria. He continues to have occasional symptoms of paresthesia of the feet but feels the gabapentin helps. Today his fasting sugar is 98, ha1c is: 6.6% an increase from previous readings. He is trying to watch his diet and is exercising at home. He is conscientious of his diet and limits his sugar intake. He only drinks water. He does not take an ace-I and is eager to stop using the metformin. He is current for diabetic eye exam w/i past year.  Patient has hx of dyslipidemia. Compliance with treatment has been good and he exercises and adheres to a low fat diet. He currently takes a statin. He denies chest pain, sob, claudication.    He has a hx of BPH and reports that symptoms have improved but he continues to dribble and have incomplete emptying. He currently takes doxazosin. He also has a hx of erectile dysfunction and uses viagra prn. He feels this medication helps his symptoms and he requests a refill.   He saw the neurologist yesterday for continued pain in his left hand post operatively. He had what he describes as an EMG. He has not seen results. He continues to have weakness in his hand and pain which he rates a 5/10. He is not currently taking medication for the pain but tylenol #3 and voltaren gel have been used in the past successfully.   Past Medical History:  Diagnosis Date  . Diabetes mellitus without complication [facility])    Social History  Substance Use Topics  . Smoking status:  Never Smoker  . Smokeless tobacco: Never Used  . Alcohol use No   ROS Review of Systems  Constitutional: Negative.   HENT: Negative.   Eyes: Negative.   Respiratory: Negative.   Cardiovascular: Negative.   Gastrointestinal: Negative.   Endocrine: Positive for polyuria. Negative for polydipsia and polyphagia.  Genitourinary: Positive for frequency. Negative for decreased urine volume, hematuria, penile pain and urgency.  Musculoskeletal:       Left wrist/hand pain  Skin: Negative.   Neurological: Negative.   Psychiatric/Behavioral: Negative.    Objective:   Today's Vitals: BP 132/75 (BP Location: Left Arm, Patient Position: Sitting, Cuff Size: Large)   Pulse 64   Temp 98.5 F (36.9 C) (Oral)   Resp 18   Ht 5\' 6"  (1.676 m)   Wt 183 lb (83 kg)   SpO2 98%   BMI 29.54 kg/m   Physical Exam  Constitutional: He is oriented to person, place, and time. He appears well-developed and well-nourished.  HENT:  Head: Normocephalic.  Right Ear: External ear normal.  Left Ear: External ear normal.  Eyes: Pupils are equal, round, and reactive to light.  Arcus senilis  Neck: Normal range of motion. No JVD present. No thyromegaly present.  Cardiovascular: Normal rate, regular rhythm and normal heart sounds.   No murmur heard. Pulmonary/Chest: Effort normal and breath sounds normal. No respiratory distress. He has no wheezes. He has no rales.  Abdominal: Soft. Bowel sounds are normal.  obese  Musculoskeletal: Normal range of motion.  Weakness left hand  Neurological: He is alert and oriented to person, place, and time.  Skin: Skin is warm and dry.  Psychiatric: He has a normal mood and affect. His behavior is normal.   CBC Latest Ref Rng & Units 07/29/2015 09/08/2012 02/18/2010  WBC 4.0 - 10.5 K/uL 7.9 7.4 6.5  Hemoglobin 13.0 - 17.0 g/dL 12.7(L) 15.3 14.6  Hematocrit 39.0 - 52.0 % 38.9(L) 43.8 44.4  Platelets 150 - 400 K/uL 190 229 231   CMP Latest Ref Rng & Units 07/29/2015  09/06/2014 08/20/2014  Glucose 65 - 99 mg/dL 161(W) 960(A) 81  BUN 6 - 20 mg/dL 12 13 14   Creatinine 0.61 - 1.24 mg/dL 5.40 9.81 1.91  Sodium 135 - 145 mmol/L 136 140 140  Potassium 3.5 - 5.1 mmol/L 4.2 5.1 5.4(H)  Chloride 101 - 111 mmol/L 99(L) 104 103  CO2 22 - 32 mmol/L 27 28 27   Calcium 8.9 - 10.3 mg/dL 9.6 47.8 29.5  Total Protein 6.0 - 8.3 g/dL - - 7.9  Total Bilirubin 0.2 - 1.2 mg/dL - - 0.4  Alkaline Phos 39 - 117 U/L - - 55  AST 0 - 37 U/L - - 31  ALT 0 - 53 U/L - - 30   Lipid Panel     Component Value Date/Time   CHOL 196 08/20/2014 1506   TRIG 287 (H) 08/20/2014 1506   HDL 49 08/20/2014 1506   CHOLHDL 4.0 08/20/2014 1506   VLDL 57 (H) 08/20/2014 1506   LDLCALC 90 08/20/2014 1506   Assessment & Plan:  DM- type 2 w/o the use of insulin- ncontrolled - his ha1c is increased today despite conservative treatmet and lifestyle modifications. He is eager to stop metformin and does not wish to start a second agent. He is not currently prescribed an ace-I as he is reluctant to start additional medications and is concerned of the overall effect of multiple medications on his body system. We will discuss this at the next visit after we have more recent lab results. He is below goal of ha1c <7% but we may need to increase efforts at lifestyle modifications. We will encourage exercise and limiting carbohydrate intake. We will check labs today and check for microalbumineria.  Dyslipidemia- He takes a statin but I am concerned of compliance with low fat diet and exercise. He remains overweight. Last lipid panel was 2 years ago so we will check one today. He has not eaten today. At that time his triglycerides were elevated. He is requesting omega 3 fatty acids. I doubt this will have an impact on his triglycerides but may benefit overall cardiac health.   BPH - symptoms are reasonably well controlled on doxazosin. We will continue this medication and follow him.   ED- he continues to have  symptoms of ED which are likely secondary to diabetes and dyslipidemia. We discussed that losing weight, exercise, and controlling his sugars will help with ED symptoms. We can continue to use viagra in the meantime.   Left hand paresthesia- We will review the results from the EMG when available. We will continue the gabapentin and refill the voltaren gel and tylenol #3. We discussed the risks associated with tylenol #3 which he verbalized understanding.  Outpatient Encounter Prescriptions as of 06/17/2016  Medication Sig  . acetaminophen-codeine (TYLENOL #3) 300-30 MG tablet Take 1 tablet by mouth every 4 (four) hours as needed.  . diclofenac sodium (VOLTAREN) 1 % GEL Apply 4 g  topically 4 (four) times daily.  Marland Kitchen. doxazosin (CARDURA) 2 MG tablet Take 0.5 tablets (1 mg total) by mouth daily.  Marland Kitchen. gabapentin (NEURONTIN) 100 MG capsule TAKE 1 CAPSULE THREE TIMES DAILY  . metFORMIN (GLUCOPHAGE) 500 MG tablet Take 1 tablet (500 mg total) by mouth 2 (two) times daily with a meal.  . rosuvastatin (CRESTOR) 20 MG tablet Take 1 tablet (20 mg total) by mouth daily.  . sildenafil (VIAGRA) 100 MG tablet Take 1 tablet (100 mg total) by mouth daily as needed for erectile dysfunction.  . [DISCONTINUED] acetaminophen-codeine (TYLENOL #3) 300-30 MG tablet TAKE 1 TABLET BY MOUTH EVERY 4 HOURS AS NEEDED  . [DISCONTINUED] diclofenac sodium (VOLTAREN) 1 % GEL Apply 4 g topically 4 (four) times daily.  . [DISCONTINUED] doxazosin (CARDURA) 1 MG tablet TAKE 1/2 TABLET EVERY DAY  . [DISCONTINUED] doxazosin (CARDURA) 2 MG tablet TAKE 1/2 TABLET BY MOUTH DAILY  . [DISCONTINUED] gabapentin (NEURONTIN) 100 MG capsule TAKE 1 CAPSULE THREE TIMES DAILY  . [DISCONTINUED] metFORMIN (GLUCOPHAGE) 500 MG tablet Take 1 tablet (500 mg total) by mouth 2 (two) times daily with a meal.  . [DISCONTINUED] rosuvastatin (CRESTOR) 20 MG tablet Take 1 tablet (20 mg total) by mouth daily.  . [DISCONTINUED] sildenafil (VIAGRA) 100 MG tablet Take 1  tablet (100 mg total) by mouth daily as needed for erectile dysfunction.  Marland Kitchen. omega-3 acid ethyl esters (LOVAZA) 1 g capsule Take 1 capsule (1 g total) by mouth 2 (two) times daily.   No facility-administered encounter medications on file as of 06/17/2016.     Follow-up: Return in about 6 months (around 12/18/2016) for Hemoglobin A1C and Follow up, DM, Follow up HTN.   Consuello MasseLauren Charell Faulk, AGNP Student  Evaluation and management procedures were performed by me with DNP Student in attendance, note written by DNP student under my supervision and collaboration. I have reviewed the note and I agree with the management and plan.   Jeanann LewandowskyJEGEDE, OLUGBEMIGA, MD, MHA, CPE, FACP, FAAP [facility] [place], [place] [phone]   06/23/2016, 7:38 PM

## 2016-06-17 NOTE — Patient Instructions (Signed)
Diabetes Mellitus and Exercise Exercising regularly is important for your overall health, especially when you have diabetes (diabetes mellitus). Exercising is not only about losing weight. It has many health benefits, such as increasing muscle strength and bone density and reducing body fat and stress. This leads to improved fitness, flexibility, and endurance, all of which result in better overall health. Exercise has additional benefits for people with diabetes, including:  Reducing appetite.  Helping to lower and control blood glucose.  Lowering blood pressure.  Helping to control amounts of fatty substances (lipids) in the blood, such as cholesterol and triglycerides.  Helping the body to respond better to insulin (improving insulin sensitivity).  Reducing how much insulin the body needs.  Decreasing the risk for heart disease by:  Lowering cholesterol and triglyceride levels.  Increasing the levels of good cholesterol.  Lowering blood glucose levels. What is my activity plan? Your health care provider or certified diabetes educator can help you make a plan for the type and frequency of exercise (activity plan) that works for you. Make sure that you:  Do at least 150 minutes of moderate-intensity or vigorous-intensity exercise each week. This could be brisk walking, biking, or water aerobics.  Do stretching and strength exercises, such as yoga or weightlifting, at least 2 times a week.  Spread out your activity over at least 3 days of the week.  Get some form of physical activity every day.  Do not go more than 2 days in a row without some kind of physical activity.  Avoid being inactive for more than 90 minutes at a time. Take frequent breaks to walk or stretch.  Choose a type of exercise or activity that you enjoy, and set realistic goals.  Start slowly, and gradually increase the intensity of your exercise over time. What do I need to know about managing my  diabetes?  Check your blood glucose before and after exercising.  If your blood glucose is higher than 240 mg/dL (13.3 mmol/L) before you exercise, check your urine for ketones. If you have ketones in your urine, do not exercise until your blood glucose returns to normal.  Know the symptoms of low blood glucose (hypoglycemia) and how to treat it. Your risk for hypoglycemia increases during and after exercise. Common symptoms of hypoglycemia can include:  Hunger.  Anxiety.  Sweating and feeling clammy.  Confusion.  Dizziness or feeling light-headed.  Increased heart rate or palpitations.  Blurry vision.  Tingling or numbness around the mouth, lips, or tongue.  Tremors or shakes.  Irritability.  Keep a rapid-acting carbohydrate snack available before, during, and after exercise to help prevent or treat hypoglycemia.  Avoid injecting insulin into areas of the body that are going to be exercised. For example, avoid injecting insulin into:  The arms, when playing tennis.  The legs, when jogging.  Keep records of your exercise habits. Doing this can help you and your health care provider adjust your diabetes management plan as needed. Write down:  Food that you eat before and after you exercise.  Blood glucose levels before and after you exercise.  The type and amount of exercise you have done.  When your insulin is expected to peak, if you use insulin. Avoid exercising at times when your insulin is peaking.  When you start a new exercise or activity, work with your health care provider to make sure the activity is safe for you, and to adjust your insulin, medicines, or food intake as needed.  Drink plenty   of water while you exercise to prevent dehydration or heat stroke. Drink enough fluid to keep your urine clear or pale yellow. This information is not intended to replace advice given to you by your health care provider. Make sure you discuss any questions you have with  your health care provider. Document Released: 06/20/2003 Document Revised: 10/18/2015 Document Reviewed: 09/09/2015 Elsevier Interactive Patient Education  2017 Elsevier Inc. Blood Glucose Monitoring, Adult Monitoring your blood sugar (glucose) helps you manage your diabetes. It also helps you and your health care provider determine how well your diabetes management plan is working. Blood glucose monitoring involves checking your blood glucose as often as directed, and keeping a record (log) of your results over time. Why should I monitor my blood glucose? Checking your blood glucose regularly can:  Help you understand how food, exercise, illnesses, and medicines affect your blood glucose.  Let you know what your blood glucose is at any time. You can quickly tell if you are having low blood glucose (hypoglycemia) or high blood glucose (hyperglycemia).  Help you and your health care provider adjust your medicines as needed. When should I check my blood glucose? Follow instructions from your health care provider about how often to check your blood glucose. This may depend on:  The type of diabetes you have.  How well-controlled your diabetes is.  Medicines you are taking. If you have type 1 diabetes:   Check your blood glucose at least 2 times a day.  Also check your blood glucose:  Before every insulin injection.  Before and after exercise.  Between meals.  2 hours after a meal.  Occasionally between 2:00 a.m. and 3:00 a.m., as directed.  Before potentially dangerous tasks, like driving or using heavy machinery.  At bedtime.  You may need to check your blood glucose more often, up to 6-10 times a day:  If you use an insulin pump.  If you need multiple daily injections (MDI).  If your diabetes is not well-controlled.  If you are ill.  If you have a history of severe hypoglycemia.  If you have a history of not knowing when your blood glucose is getting low  (hypoglycemia unawareness). If you have type 2 diabetes:   If you take insulin or other diabetes medicines, check your blood glucose at least 2 times a day.  If you are on intensive insulin therapy, check your blood glucose at least 4 times a day. Occasionally, you may also need to check between 2:00 a.m. and 3:00 a.m., as directed.  Also check your blood glucose:  Before and after exercise.  Before potentially dangerous tasks, like driving or using heavy machinery.  You may need to check your blood glucose more often if:  Your medicine is being adjusted.  Your diabetes is not well-controlled.  You are ill. What is a blood glucose log?  A blood glucose log is a record of your blood glucose readings. It helps you and your health care provider:  Look for patterns in your blood glucose over time.  Adjust your diabetes management plan as needed.  Every time you check your blood glucose, write down your result and notes about things that may be affecting your blood glucose, such as your diet and exercise for the day.  Most glucose meters store a record of glucose readings in the meter. Some meters allow you to download your records to a computer. How do I check my blood glucose? Follow these steps to get accurate readings of   your blood glucose: Supplies needed    Blood glucose meter.  Test strips for your meter. Each meter has its own strips. You must use the strips that come with your meter.  A needle to prick your finger (lancet). Do not use lancets more than once.  A device that holds the lancet (lancing device).  A journal or log book to write down your results. Procedure   Wash your hands with soap and water.  Prick the side of your finger (not the tip) with the lancet. Use a different finger each time.  Gently rub the finger until a small drop of blood appears.  Follow instructions that come with your meter for inserting the test strip, applying blood to the  strip, and using your blood glucose meter.  Write down your result and any notes. Alternative testing sites   Some meters allow you to use areas of your body other than your finger (alternative sites) to test your blood.  If you think you may have hypoglycemia, or if you have hypoglycemia unawareness, do not use alternative sites. Use your finger instead.  Alternative sites may not be as accurate as the fingers, because blood flow is slower in these areas. This means that the result you get may be delayed, and it may be different from the result that you would get from your finger.  The most common alternative sites are:  Forearm.  Thigh.  Palm of the hand. Additional tips   Always keep your supplies with you.  If you have questions or need help, all blood glucose meters have a 24-hour "hotline" number that you can call. You may also contact your health care provider.  After you use a few boxes of test strips, adjust (calibrate) your blood glucose meter by following instructions that came with your meter. This information is not intended to replace advice given to you by your health care provider. Make sure you discuss any questions you have with your health care provider. Document Released: 04/02/2003 Document Revised: 10/18/2015 Document Reviewed: 09/09/2015 Elsevier Interactive Patient Education  2017 Elsevier Inc.  

## 2016-06-18 LAB — URINALYSIS, COMPLETE
Bacteria, UA: NONE SEEN [HPF]
Bilirubin Urine: NEGATIVE
CASTS: NONE SEEN [LPF]
Crystals: NONE SEEN [HPF]
Glucose, UA: NEGATIVE
Hgb urine dipstick: NEGATIVE
Ketones, ur: NEGATIVE
Leukocytes, UA: NEGATIVE
NITRITE: NEGATIVE
PH: 7.5 (ref 5.0–8.0)
RBC / HPF: NONE SEEN RBC/HPF (ref <=2)
SPECIFIC GRAVITY, URINE: 1.024 (ref 1.001–1.035)
Squamous Epithelial / LPF: NONE SEEN [HPF] (ref <=5)
WBC, UA: NONE SEEN WBC/HPF (ref <=5)
YEAST: NONE SEEN [HPF]

## 2016-06-18 LAB — MICROALBUMIN / CREATININE URINE RATIO
CREATININE, URINE: 288 mg/dL (ref 20–370)
MICROALB UR: 1.2 mg/dL
Microalb Creat Ratio: 4 mcg/mg creat (ref <30)

## 2016-06-19 MED FILL — ACETAMINOPHEN/COD #3 TABLET: 300-30 | 10 days supply | Qty: 60 | Fill #0

## 2016-06-26 ENCOUNTER — Telehealth: Payer: Self-pay | Admitting: Internal Medicine

## 2016-06-26 NOTE — Telephone Encounter (Signed)
Thank you for the FYI

## 2016-06-26 NOTE — Telephone Encounter (Signed)
Pt. Came into facility to drop off a letter that he received. Pt. Would like for his PCP to have a copy. Letter will be put in PCP box.

## 2016-06-30 NOTE — Telephone Encounter (Signed)
Patient is aware of labs being within normal limits. Medical Assistant left message on patient's home and cell voicemail. Voicemail states to give a call back to [place] [facility] at [phone].

## 2016-06-30 NOTE — Telephone Encounter (Signed)
-----   Message from Quentin Angstlugbemiga E Jegede, MD sent at 06/24/2016  7:03 PM EDT ----- Please inform patient that his lab results are within normal limits

## 2016-07-02 ENCOUNTER — Telehealth: Payer: Self-pay | Admitting: Internal Medicine

## 2016-07-02 NOTE — Telephone Encounter (Signed)
Please call patient come in for BP check and medication review. Thanks

## 2016-07-02 NOTE — Telephone Encounter (Signed)
Took these 3 medications 2 days ago and still experiencing dizziness. Tylenol #3, doxazosin (whole tablet), and Crestor Has had fluids today - 2 glasses of water and milk. Has eaten cereal, beans, and plantain   "Couldn't get up yesterday, better today".   He states he has not taken any medications today.  "Supposed to go to work Friday night/12 [place] "  Denies blurred vision, syncope, slurred speech, or weakness. Encouraged drink at least 8 glasses of water. To take doxazosin 1 mg (take 1/2). Does not have BP machine at home, unable to check BP.  Supposed to go to work Friday night/[employer] [place]   Will route to provider.

## 2016-07-02 NOTE — Telephone Encounter (Signed)
Patient called the office to speak with nurse regarding the dizziness that he is experiencing since taking his medication. Patient is worried. He mentioned that he took 3 different pills at the same time. Please follow up.  Thank you.

## 2016-07-03 ENCOUNTER — Ambulatory Visit: Payer: Medicare HMO | Attending: Internal Medicine | Admitting: *Deleted

## 2016-07-03 DIAGNOSIS — I10 Essential (primary) hypertension: Secondary | ICD-10-CM | POA: Diagnosis not present

## 2016-07-03 DIAGNOSIS — Z0130 Encounter for examination of blood pressure without abnormal findings: Secondary | ICD-10-CM | POA: Diagnosis not present

## 2016-07-03 NOTE — Telephone Encounter (Addendum)
Pt aware of message, will come in for BP check/ med review apt. Scheduled for 07/08/16

## 2016-07-03 NOTE — Progress Notes (Addendum)
Pt arrived for BP check. He has felt dizzy since taking Tylenol with Codeine 3 days ago.  Pt states he did not take BP medications this morning Only medications he has taken today was omega  3 and metformin. Has not taken gabapentin 1 week.  Pt denies chest pain, SOB, new vison concerns, or generalized swelling. Non fasting blood sugar is 131.  States he has a mild headache at times.  Neurological assessment WNL. Pt alert and oriented x 4. Pupils equal and reactive to light. No facial drooping on assessment. Bilateral grips are present and strong. Left hand grip is slightly weaker than right hand. Pt states that is he has little weakness normally from previous surgery. Gait is WNL.   Sx's reviewed with Dr. Hyman HopesJegede. Appointment with PCP, Dr. Hyman HopesJegede on Wednesday 3/28.

## 2016-07-06 MED FILL — $VIAGRA 100 MG TABLET: 100 | 30 days supply | Qty: 10 | Fill #6

## 2016-07-08 ENCOUNTER — Encounter: Payer: Self-pay | Admitting: Internal Medicine

## 2016-07-08 ENCOUNTER — Ambulatory Visit: Payer: Medicare HMO | Attending: Internal Medicine | Admitting: Internal Medicine

## 2016-07-08 VITALS — BP 129/67 | HR 65 | Temp 98.4°F | Resp 18 | Ht 66.0 in | Wt 184.0 lb

## 2016-07-08 DIAGNOSIS — E785 Hyperlipidemia, unspecified: Secondary | ICD-10-CM | POA: Diagnosis not present

## 2016-07-08 DIAGNOSIS — E119 Type 2 diabetes mellitus without complications: Secondary | ICD-10-CM | POA: Diagnosis not present

## 2016-07-08 DIAGNOSIS — Z7984 Long term (current) use of oral hypoglycemic drugs: Secondary | ICD-10-CM | POA: Diagnosis not present

## 2016-07-08 DIAGNOSIS — R42 Dizziness and giddiness: Secondary | ICD-10-CM | POA: Insufficient documentation

## 2016-07-08 DIAGNOSIS — M4722 Other spondylosis with radiculopathy, cervical region: Secondary | ICD-10-CM | POA: Diagnosis not present

## 2016-07-08 MED ORDER — MECLIZINE HCL 25 MG PO TABS: 25.0000 mg | ORAL_TABLET | Freq: Three times a day (TID) | ORAL | 0 refills | Status: DC | PRN

## 2016-07-08 NOTE — Patient Instructions (Signed)
Blood Glucose Monitoring, Adult Monitoring your blood sugar (glucose) helps you manage your diabetes. It also helps you and your health care provider determine how well your diabetes management plan is working. Blood glucose monitoring involves checking your blood glucose as often as directed, and keeping a record (log) of your results over time. Why should I monitor my blood glucose? Checking your blood glucose regularly can:  Help you understand how food, exercise, illnesses, and medicines affect your blood glucose.  Let you know what your blood glucose is at any time. You can quickly tell if you are having low blood glucose (hypoglycemia) or high blood glucose (hyperglycemia).  Help you and your health care provider adjust your medicines as needed. When should I check my blood glucose? Follow instructions from your health care provider about how often to check your blood glucose. This may depend on:  The type of diabetes you have.  How well-controlled your diabetes is.  Medicines you are taking. If you have type 1 diabetes:   Check your blood glucose at least 2 times a day.  Also check your blood glucose:  Before every insulin injection.  Before and after exercise.  Between meals.  2 hours after a meal.  Occasionally between 2:00 a.m. and 3:00 a.m., as directed.  Before potentially dangerous tasks, like driving or using heavy machinery.  At bedtime.  You may need to check your blood glucose more often, up to 6-10 times a day:  If you use an insulin pump.  If you need multiple daily injections (MDI).  If your diabetes is not well-controlled.  If you are ill.  If you have a history of severe hypoglycemia.  If you have a history of not knowing when your blood glucose is getting low (hypoglycemia unawareness). If you have type 2 diabetes:   If you take insulin or other diabetes medicines, check your blood glucose at least 2 times a day.  If you are on intensive  insulin therapy, check your blood glucose at least 4 times a day. Occasionally, you may also need to check between 2:00 a.m. and 3:00 a.m., as directed.  Also check your blood glucose:  Before and after exercise.  Before potentially dangerous tasks, like driving or using heavy machinery.  You may need to check your blood glucose more often if:  Your medicine is being adjusted.  Your diabetes is not well-controlled.  You are ill. What is a blood glucose log?  A blood glucose log is a record of your blood glucose readings. It helps you and your health care provider:  Look for patterns in your blood glucose over time.  Adjust your diabetes management plan as needed.  Every time you check your blood glucose, write down your result and notes about things that may be affecting your blood glucose, such as your diet and exercise for the day.  Most glucose meters store a record of glucose readings in the meter. Some meters allow you to download your records to a computer. How do I check my blood glucose? Follow these steps to get accurate readings of your blood glucose: Supplies needed    Blood glucose meter.  Test strips for your meter. Each meter has its own strips. You must use the strips that come with your meter.  A needle to prick your finger (lancet). Do not use lancets more than once.  A device that holds the lancet (lancing device).  A journal or log book to write down your results. Procedure  Wash your hands with soap and water.  Prick the side of your finger (not the tip) with the lancet. Use a different finger each time.  Gently rub the finger until a small drop of blood appears.  Follow instructions that come with your meter for inserting the test strip, applying blood to the strip, and using your blood glucose meter.  Write down your result and any notes. Alternative testing sites   Some meters allow you to use areas of your body other than your finger  (alternative sites) to test your blood.  If you think you may have hypoglycemia, or if you have hypoglycemia unawareness, do not use alternative sites. Use your finger instead.  Alternative sites may not be as accurate as the fingers, because blood flow is slower in these areas. This means that the result you get may be delayed, and it may be different from the result that you would get from your finger.  The most common alternative sites are:  Forearm.  Thigh.  Palm of the hand. Additional tips   Always keep your supplies with you.  If you have questions or need help, all blood glucose meters have a 24-hour "hotline" number that you can call. You may also contact your health care provider.  After you use a few boxes of test strips, adjust (calibrate) your blood glucose meter by following instructions that came with your meter. This information is not intended to replace advice given to you by your health care provider. Make sure you discuss any questions you have with your health care provider. Document Released: 04/02/2003 Document Revised: 10/18/2015 Document Reviewed: 09/09/2015 Elsevier Interactive Patient Education  2017 [employer] Inc. Dizziness Dizziness is a common problem. It is a feeling of unsteadiness or light-headedness. You may feel like you are about to faint. Dizziness can lead to injury if you stumble or fall. Anyone can become dizzy, but dizziness is more common in older adults. This condition can be caused by a number of things, including medicines, dehydration, or illness. Follow these instructions at home: Taking these steps may help with your condition: Eating and drinking   Drink enough fluid to keep your urine clear or pale yellow. This helps to keep you from becoming dehydrated. Try to drink more clear fluids, such as water.  Do not drink alcohol.  Limit your caffeine intake if directed by your health care provider.  Limit your salt intake if directed by  your health care provider. Activity   Avoid making quick movements.  Rise slowly from chairs and steady yourself until you feel okay.  In the morning, first sit up on the side of the bed. When you feel okay, stand slowly while you hold onto something until you know that your balance is fine.  Move your legs often if you need to stand in one place for a long time. Tighten and relax your muscles in your legs while you are standing.  Do not drive or operate heavy machinery if you feel dizzy.  Avoid bending down if you feel dizzy. Place items in your home so that they are easy for you to reach without leaning over. Lifestyle   Do not use any tobacco products, including cigarettes, chewing tobacco, or electronic cigarettes. If you need help quitting, ask your health care provider.  Try to reduce your stress level, such as with yoga or meditation. Talk with your health care provider if you need help. General instructions   Watch your dizziness for any changes.  Take medicines only as directed by your health care provider. Talk with your health care provider if you think that your dizziness is caused by a medicine that you are taking.  Tell a friend or a family member that you are feeling dizzy. If he or she notices any changes in your behavior, have this person call your health care provider.  Keep all follow-up visits as directed by your health care provider. This is important. Contact a health care provider if:  Your dizziness does not go away.  Your dizziness or light-headedness gets worse.  You feel nauseous.  You have reduced hearing.  You have new symptoms.  You are unsteady on your feet or you feel like the room is spinning. Get help right away if:  You vomit or have diarrhea and are unable to eat or drink anything.  You have problems talking, walking, swallowing, or using your arms, hands, or legs.  You feel generally weak.  You are not thinking clearly or you have  trouble forming sentences. It may take a friend or family member to notice this.  You have chest pain, abdominal pain, shortness of breath, or sweating.  Your vision changes.  You notice any bleeding.  You have a headache.  You have neck pain or a stiff neck.  You have a fever. This information is not intended to replace advice given to you by your health care provider. Make sure you discuss any questions you have with your health care provider. Document Released: 09/23/2000 Document Revised: 09/05/2015 Document Reviewed: 03/26/2014 Elsevier Interactive Patient Education  2017 [employer]

## 2016-07-08 NOTE — Progress Notes (Signed)
Connor Murray, is a 66 y.o. male  [id number]  [id number]  DOB - 1950-09-14  Chief Complaint  Patient presents with  . Medication Management      Subjective:   Connor Murray is a 66 y.o. male here today for a sck visit. Patient started having dizziness and light headedness that he attributes to taking too many pills. He stated that he takes his Robaxin, Tylenol 3 and Cadura at the same time and shortly after feels dizzy and feels like the room is spinning. He denies any syncopal attack but fearful that something major is wrong. He denies any chest pain. No blurry vision. No double vision. Patient has No headache, No abdominal pain - No Nausea, No new weakness tingling or numbness, No Cough - SOB.  Problem  Dizziness and Giddiness   ALLERGIES: No Known Allergies  PAST MEDICAL HISTORY: Past Medical History:  Diagnosis Date  . Diabetes mellitus without complication (HCC)     MEDICATIONS AT HOME: Prior to Admission medications   Medication Sig Start Date End Date Taking? Authorizing Provider  diclofenac sodium (VOLTAREN) 1 % GEL Apply 4 g topically 4 (four) times daily. 06/17/16  Yes Quentin Angst, MD  doxazosin (CARDURA) 2 MG tablet Take 0.5 tablets (1 mg total) by mouth daily. 06/17/16  Yes Zela Sobieski Annitta Needs, MD  gabapentin (NEURONTIN) 100 MG capsule TAKE 1 CAPSULE THREE TIMES DAILY 06/17/16  Yes Quentin Angst, MD  metFORMIN (GLUCOPHAGE) 500 MG tablet Take 1 tablet (500 mg total) by mouth 2 (two) times daily with a meal. 06/17/16  Yes Asa Fath E Hyman Hopes, MD  omega-3 acid ethyl esters (LOVAZA) 1 g capsule Take 1 capsule (1 g total) by mouth 2 (two) times daily. 06/17/16  Yes Quentin Angst, MD  rosuvastatin (CRESTOR) 20 MG tablet Take 1 tablet (20 mg total) by mouth daily. 06/17/16  Yes Quentin Angst, MD  sildenafil (VIAGRA) 100 MG tablet Take 1 tablet (100 mg total) by mouth daily as needed for erectile dysfunction. 06/17/16  Yes Quentin Angst, MD    meclizine (ANTIVERT) 25 MG tablet Take 1 tablet (25 mg total) by mouth 3 (three) times daily as needed for dizziness. 07/08/16   Quentin Angst, MD   Objective:   Vitals:   07/08/16 1204  BP: 129/67  Pulse: 65  Resp: 18  Temp: 98.4 F (36.9 C)  TempSrc: Oral  SpO2: 100%  Weight: 184 lb (83.5 kg)  Height: 5\' 6"  (1.676 m)   Exam General appearance : Awake, alert, not in any distress. Speech Clear. Not toxic looking HEENT: Atraumatic and Normocephalic, pupils equally reactive to light and accomodation Neck: Supple, no JVD. No cervical lymphadenopathy.  Chest: Good air entry bilaterally, no added sounds  CVS: S1 S2 regular, no murmurs.  Abdomen: Bowel sounds present, Non tender and not distended with no gaurding, rigidity or rebound. Extremities: B/L Lower Ext shows no edema, both legs are warm to touch Neurology: Awake alert, and oriented X 3, CN II-XII intact, Non focal Skin: No Rash  Data Review Lab Results  Component Value Date   HGBA1C 6.6 06/17/2016   HGBA1C 6.5 01/23/2016   HGBA1C 5.9 06/20/2015   Assessment & Plan   1. Dizziness and giddiness  Negative orthostatic vitals  - ECHOCARDIOGRAM COMPLETE; Future - VAS US CAROTID; Future - meclizine (ANTIVERT) 25 MG tablet; Take 1 tablet (25 mg total) by mouth 3 (three) times daily as needed for dizziness.  Dispense: 30 tablet; Refill: 0 - Stop  Tylenol #3 and Robaxin  2. Type 2 diabetes mellitus without complication, without long-term current use of insulin (HCC)  Aim for 30 minutes of exercise most days. Rethink what you drink. Water is great! Aim for 2-3 Carb Choices per meal (30-45 grams) +/- 1 either way  Aim for 0-15 Carbs per snack if hungry  Include protein in moderation with your meals and snacks  Consider reading food labels for Total Carbohydrate and Fat Grams of foods  Consider checking BG at alternate times per day  Continue taking medication as directed Be mindful about how much sugar you are  adding to beverages and other foods. Fruit Punch - find one with no sugar  Measure and decrease portions of carbohydrate foods  Make your plate and don't go back for seconds  3. Dyslipidemia  To address this please limit saturated fat to no more than 7% of your calories, limit cholesterol to 200 mg/day, increase fiber and exercise as tolerated. If needed we may add another cholesterol lowering medication to your regimen.   Patient have been counseled extensively about nutrition and exercise. Other issues discussed during this visit include: low cholesterol diet, weight control and daily exercise, foot care, annual eye examinations at Ophthalmology, importance of adherence with medications and regular follow-up. We also discussed long term complications of uncontrolled diabetes and hypertension.   Return in about 3 months (around 10/08/2016) for Hemoglobin A1C and Follow up, DM, Follow up HTN.  The patient was given clear instructions to go to ER or return to medical center if symptoms don't improve, worsen or new problems develop. The patient verbalized understanding. The patient was told to call to get lab results if they haven't heard anything in the next week.   This note has been created with Education officer, environmentalDragon speech recognition software and smart phrase technology. Any transcriptional errors are unintentional.    Jeanann LewandowskyJEGEDE, Heman Que, MD, MHA, Maxwell CaulFACP, FAAP, CPE [facility] [place], [place] [phone]   07/08/2016, 12:39 PM

## 2016-07-08 NOTE — Progress Notes (Signed)
Patient is here for MED MANAGEMENT  Patient states he was taking Tylenol 3, Robaxin and Cardura all at once before bedtime. Patient became dizzy and had syncope episodes.  Patient has not taken medication for 2 days. Patient complains of still having the "drunk" feeling.

## 2016-07-10 ENCOUNTER — Ambulatory Visit (HOSPITAL_COMMUNITY)
Admission: RE | Admit: 2016-07-10 | Discharge: 2016-07-10 | Disposition: A | Discharge status: Home / Self Care | Payer: Medicare HMO | Source: Ambulatory Visit | Attending: Internal Medicine | Admitting: Internal Medicine

## 2016-07-10 DIAGNOSIS — I7789 Other specified disorders of arteries and arterioles: Secondary | ICD-10-CM | POA: Insufficient documentation

## 2016-07-10 DIAGNOSIS — R42 Dizziness and giddiness: Secondary | ICD-10-CM | POA: Diagnosis not present

## 2016-07-10 LAB — VAS US CAROTID
LCCADDIAS: -20 cm/s
LCCAPDIAS: -14 cm/s
LCCAPSYS: -89 cm/s
LEFT ECA DIAS: -6 cm/s
LEFT VERTEBRAL DIAS: -10 cm/s
LICADDIAS: -24 cm/s
LICADSYS: -77 cm/s
LICAPDIAS: -24 cm/s
LICAPSYS: -101 cm/s
Left CCA dist sys: -106 cm/s
RIGHT ECA DIAS: -10 cm/s
RIGHT VERTEBRAL DIAS: -8 cm/s
Right CCA prox dias: -18 cm/s
Right CCA prox sys: -95 cm/s
Right cca dist sys: -68 cm/s

## 2016-07-10 NOTE — Progress Notes (Signed)
**  Preliminary report by tech**  Carotid artery duplex complete. Findings are consistent with a 1-39 percent stenosis involving the right internal carotid artery and the left internal carotid artery. The vertebral arteries demonstrate antegrade flow.  07/10/16 10:36 AM Olen Cordial RVT

## 2016-07-17 NOTE — Telephone Encounter (Signed)
Patient verified DOB Patient is aware of US of the carotid being normal. Patient expressed his understanding and had no further questions at this time.

## 2016-07-17 NOTE — Telephone Encounter (Signed)
-----   Message from Quentin Angst, MD sent at 07/17/2016  3:33 PM EDT ----- Please inform patient that his neck vessel ultrasound is essentially unremarkable.

## 2016-07-20 ENCOUNTER — Ambulatory Visit (HOSPITAL_COMMUNITY)
Admission: RE | Admit: 2016-07-20 | Discharge: 2016-07-20 | Disposition: A | Discharge status: Home / Self Care | Payer: Medicare HMO | Source: Ambulatory Visit | Attending: Internal Medicine | Admitting: Internal Medicine

## 2016-07-20 DIAGNOSIS — R55 Syncope and collapse: Secondary | ICD-10-CM | POA: Diagnosis not present

## 2016-07-20 DIAGNOSIS — R42 Dizziness and giddiness: Secondary | ICD-10-CM | POA: Diagnosis not present

## 2016-07-20 LAB — ECHOCARDIOGRAM COMPLETE
AVLVOTPG: 3 mmHg
E decel time: 211 msec
EERAT: 6.92
FS: 35 % (ref 28–44)
IV/PV OW: 0.86
LA vol: 36.3 mL
LADIAMINDEX: 1.45 cm/m2
LASIZE: 29 mm
LAVOLA4C: 35.7 mL
LAVOLIN: 18.2 mL/m2
LEFT ATRIUM END SYS DIAM: 29 mm
LV TDI E'LATERAL: 8.38
LV TDI E'MEDIAL: 9.46
LV e' LATERAL: 8.38 cm/s
LVEEAVG: 6.92
LVEEMED: 6.92
LVOT SV: 65 mL
LVOT VTI: 18.8 cm
LVOT area: 3.46 cm2
LVOTD: 21 mm
LVOTPV: 92.5 cm/s
Lateral S' vel: 14.3 cm/s
MV Dec: 211
MVPKAVEL: 74.4 m/s
MVPKEVEL: 58 m/s
PW: 9.73 mm — AB (ref 0.6–1.1)
TAPSE: 29.1 mm

## 2016-07-20 NOTE — Progress Notes (Signed)
  Echocardiogram 2D Echocardiogram has been performed.  Connor Murray 07/20/2016, 11:52 AM

## 2016-07-21 ENCOUNTER — Telehealth: Payer: Self-pay | Admitting: *Deleted

## 2016-07-21 NOTE — Telephone Encounter (Signed)
Patient verified DOB Patient is aware of heart ultrasound being normal. No further questions at this time.

## 2016-07-21 NOTE — Telephone Encounter (Signed)
-----   Message from Quentin Angst, MD sent at 07/21/2016  8:44 AM EDT ----- Heart ultrasound was normal

## 2016-08-05 MED FILL — $VIAGRA 100 MG TABLET: 100 | 30 days supply | Qty: 10 | Fill #7

## 2016-09-03 MED FILL — $VIAGRA 100 MG TABLET: 100 | 30 days supply | Qty: 10 | Fill #8

## 2016-09-25 ENCOUNTER — Other Ambulatory Visit: Payer: Self-pay | Admitting: Internal Medicine

## 2016-09-25 DIAGNOSIS — R202 Paresthesia of skin: Secondary | ICD-10-CM

## 2016-10-05 MED FILL — $VIAGRA 100 MG TABLET: 100 | 30 days supply | Qty: 10 | Fill #9

## 2016-10-07 ENCOUNTER — Ambulatory Visit: Payer: Medicare HMO | Attending: Internal Medicine | Admitting: Internal Medicine

## 2016-10-07 ENCOUNTER — Encounter: Payer: Self-pay | Admitting: Internal Medicine

## 2016-10-07 VITALS — BP 138/70 | HR 81 | Temp 98.7°F | Resp 18 | Ht 64.0 in | Wt 185.6 lb

## 2016-10-07 DIAGNOSIS — N400 Enlarged prostate without lower urinary tract symptoms: Secondary | ICD-10-CM | POA: Insufficient documentation

## 2016-10-07 DIAGNOSIS — Z79899 Other long term (current) drug therapy: Secondary | ICD-10-CM | POA: Insufficient documentation

## 2016-10-07 DIAGNOSIS — E785 Hyperlipidemia, unspecified: Secondary | ICD-10-CM | POA: Diagnosis not present

## 2016-10-07 DIAGNOSIS — Z7984 Long term (current) use of oral hypoglycemic drugs: Secondary | ICD-10-CM | POA: Insufficient documentation

## 2016-10-07 DIAGNOSIS — E119 Type 2 diabetes mellitus without complications: Secondary | ICD-10-CM

## 2016-10-07 LAB — GLUCOSE, POCT (MANUAL RESULT ENTRY): POC GLUCOSE: 133 mg/dL — AB (ref 70–99)

## 2016-10-07 LAB — POCT GLYCOSYLATED HEMOGLOBIN (HGB A1C): Hemoglobin A1C: 6.1

## 2016-10-07 NOTE — Progress Notes (Signed)
Connor Murray, is a 66 y.o. male  [id number]  [id number]  DOB - Jul 19, 1950  Chief Complaint  Patient presents with  . Diabetes       Subjective:   Connor Murray is a 66 y.o. male with history of type 2 diabetes mellitus, dyslipidemia and BPH here today for a follow up diabetes. He has no complaint today. Wife says he has been eating plantain and beans daily for the past few months. He also exercises regularly. He is adherent with medications, reports no side effect. Patient has No headache, No chest pain, No abdominal pain - No Nausea, No new weakness tingling or numbness, No Cough - SOB.  No problems updated.  ALLERGIES: No Known Allergies  PAST MEDICAL HISTORY: Past Medical History:  Diagnosis Date  . Diabetes mellitus without complication (HCC)     MEDICATIONS AT HOME: Prior to Admission medications   Medication Sig Start Date End Date Taking? Authorizing Provider  diclofenac sodium (VOLTAREN) 1 % GEL Apply 4 g topically 4 (four) times daily. 06/17/16  Yes Quentin Angst, MD  doxazosin (CARDURA) 2 MG tablet Take 0.5 tablets (1 mg total) by mouth daily. 06/17/16  Yes Jaydalyn Demattia E, MD  gabapentin (NEURONTIN) 100 MG capsule TAKE 1 CAPSULE THREE TIMES DAILY 09/28/16  Yes Quentin Angst, MD  meclizine (ANTIVERT) 25 MG tablet Take 1 tablet (25 mg total) by mouth 3 (three) times daily as needed for dizziness. 07/08/16  Yes Quentin Angst, MD  metFORMIN (GLUCOPHAGE) 500 MG tablet Take 1 tablet (500 mg total) by mouth 2 (two) times daily with a meal. 06/17/16  Yes Cammeron Greis E, MD  omega-3 acid ethyl esters (LOVAZA) 1 g capsule Take 1 capsule (1 g total) by mouth 2 (two) times daily. 06/17/16  Yes Quentin Angst, MD  rosuvastatin (CRESTOR) 20 MG tablet Take 1 tablet (20 mg total) by mouth daily. 06/17/16  Yes Quentin Angst, MD  sildenafil (VIAGRA) 100 MG tablet Take 1 tablet (100 mg total) by mouth daily as needed for erectile dysfunction.  06/17/16  Yes Quentin Angst, MD    Objective:   Vitals:   10/07/16 1441  BP: 138/70  Pulse: 81  Resp: 18  Temp: 98.7 F (37.1 C)  TempSrc: Oral  SpO2: 98%  Weight: 185 lb 9.6 oz (84.2 kg)  Height: 5\' 4"  (1.626 m)   Exam General appearance : Awake, alert, not in any distress. Speech Clear. Not toxic looking HEENT: Atraumatic and Normocephalic, pupils equally reactive to light and accomodation Neck: Supple, no JVD. No cervical lymphadenopathy.  Chest: Good air entry bilaterally, no added sounds  CVS: S1 S2 regular, no murmurs.  Abdomen: Bowel sounds present, Non tender and not distended with no gaurding, rigidity or rebound. Extremities: B/L Lower Ext shows no edema, both legs are warm to touch Neurology: Awake alert, and oriented X 3, CN II-XII intact, Non focal Skin: No Rash  Data Review Lab Results  Component Value Date   HGBA1C 6.1 10/07/2016   HGBA1C 6.6 06/17/2016   HGBA1C 6.5 01/23/2016    Assessment & Plan   1. Type 2 diabetes mellitus without complication, without long-term current use of insulin (HCC)  - POCT A1C is better today, 6.1% - Glucose (CBG) - Ambulatory Referral to Ophthalmologist for diabetic Eye Exam  2. Dyslipidemia  To address this please limit saturated fat to no more than 7% of your calories, limit cholesterol to 200 mg/day, increase fiber and exercise as tolerated. If needed  we may add another cholesterol lowering medication to your regimen.   Patient have been counseled extensively about nutrition and exercise. Other issues discussed during this visit include: low cholesterol diet, weight control and daily exercise, foot care, annual eye examinations at Ophthalmology, importance of adherence with medications and regular follow-up. We also discussed long term complications of uncontrolled diabetes and hypertension.   Return in about 3 months (around 01/07/2017) for Hemoglobin A1C and Follow up, DM, Follow up HTN.  The patient was given  clear instructions to go to ER or return to medical center if symptoms don't improve, worsen or new problems develop. The patient verbalized understanding. The patient was told to call to get lab results if they haven't heard anything in the next week.   This note has been created with Education officer, environmentalDragon speech recognition software and smart phrase technology. Any transcriptional errors are unintentional.    Jeanann LewandowskyJEGEDE, Kemyah Buser, MD, MHA, Maxwell CaulFACP, FAAP, CPE [facility] [place], [place] [phone]   10/07/2016, 3:25 PM

## 2016-10-07 NOTE — Progress Notes (Signed)
Dm  ? ?

## 2016-10-07 NOTE — Patient Instructions (Signed)
Diabetes Mellitus and Exercise Exercising regularly is important for your overall health, especially when you have diabetes (diabetes mellitus). Exercising is not only about losing weight. It has many health benefits, such as increasing muscle strength and bone density and reducing body fat and stress. This leads to improved fitness, flexibility, and endurance, all of which result in better overall health. Exercise has additional benefits for people with diabetes, including:  Reducing appetite.  Helping to lower and control blood glucose.  Lowering blood pressure.  Helping to control amounts of fatty substances (lipids) in the blood, such as cholesterol and triglycerides.  Helping the body to respond better to insulin (improving insulin sensitivity).  Reducing how much insulin the body needs.  Decreasing the risk for heart disease by: ? Lowering cholesterol and triglyceride levels. ? Increasing the levels of good cholesterol. ? Lowering blood glucose levels.  What is my activity plan? Your health care provider or certified diabetes educator can help you make a plan for the type and frequency of exercise (activity plan) that works for you. Make sure that you:  Do at least 150 minutes of moderate-intensity or vigorous-intensity exercise each week. This could be brisk walking, biking, or water aerobics. ? Do stretching and strength exercises, such as yoga or weightlifting, at least 2 times a week. ? Spread out your activity over at least 3 days of the week.  Get some form of physical activity every day. ? Do not go more than 2 days in a row without some kind of physical activity. ? Avoid being inactive for more than 90 minutes at a time. Take frequent breaks to walk or stretch.  Choose a type of exercise or activity that you enjoy, and set realistic goals.  Start slowly, and gradually increase the intensity of your exercise over time.  What do I need to know about managing my  diabetes?  Check your blood glucose before and after exercising. ? If your blood glucose is higher than 240 mg/dL (13.3 mmol/L) before you exercise, check your urine for ketones. If you have ketones in your urine, do not exercise until your blood glucose returns to normal.  Know the symptoms of low blood glucose (hypoglycemia) and how to treat it. Your risk for hypoglycemia increases during and after exercise. Common symptoms of hypoglycemia can include: ? Hunger. ? Anxiety. ? Sweating and feeling clammy. ? Confusion. ? Dizziness or feeling light-headed. ? Increased heart rate or palpitations. ? Blurry vision. ? Tingling or numbness around the mouth, lips, or tongue. ? Tremors or shakes. ? Irritability.  Keep a rapid-acting carbohydrate snack available before, during, and after exercise to help prevent or treat hypoglycemia.  Avoid injecting insulin into areas of the body that are going to be exercised. For example, avoid injecting insulin into: ? The arms, when playing tennis. ? The legs, when jogging.  Keep records of your exercise habits. Doing this can help you and your health care provider adjust your diabetes management plan as needed. Write down: ? Food that you eat before and after you exercise. ? Blood glucose levels before and after you exercise. ? The type and amount of exercise you have done. ? When your insulin is expected to peak, if you use insulin. Avoid exercising at times when your insulin is peaking.  When you start a new exercise or activity, work with your health care provider to make sure the activity is safe for you, and to adjust your insulin, medicines, or food intake as needed.    Drink plenty of water while you exercise to prevent dehydration or heat stroke. Drink enough fluid to keep your urine clear or pale yellow. This information is not intended to replace advice given to you by your health care provider. Make sure you discuss any questions you have with  your health care provider. Document Released: 06/20/2003 Document Revised: 10/18/2015 Document Reviewed: 09/09/2015 Elsevier Interactive Patient Education  2018 [employer] Blood Glucose Monitoring, Adult Monitoring your blood sugar (glucose) helps you manage your diabetes. It also helps you and your health care provider determine how well your diabetes management plan is working. Blood glucose monitoring involves checking your blood glucose as often as directed, and keeping a record (log) of your results over time. Why should I monitor my blood glucose? Checking your blood glucose regularly can:  Help you understand how food, exercise, illnesses, and medicines affect your blood glucose.  Let you know what your blood glucose is at any time. You can quickly tell if you are having low blood glucose (hypoglycemia) or high blood glucose (hyperglycemia).  Help you and your health care provider adjust your medicines as needed.  When should I check my blood glucose? Follow instructions from your health care provider about how often to check your blood glucose. This may depend on:  The type of diabetes you have.  How well-controlled your diabetes is.  Medicines you are taking.  If you have type 1 diabetes:  Check your blood glucose at least 2 times a day.  Also check your blood glucose: ? Before every insulin injection. ? Before and after exercise. ? Between meals. ? 2 hours after a meal. ? Occasionally between 2:00 a.m. and 3:00 a.m., as directed. ? Before potentially dangerous tasks, like driving or using heavy machinery. ? At bedtime.  You may need to check your blood glucose more often, up to 6-10 times a day: ? If you use an insulin pump. ? If you need multiple daily injections (MDI). ? If your diabetes is not well-controlled. ? If you are ill. ? If you have a history of severe hypoglycemia. ? If you have a history of not knowing when your blood glucose is getting low  (hypoglycemia unawareness). If you have type 2 diabetes:  If you take insulin or other diabetes medicines, check your blood glucose at least 2 times a day.  If you are on intensive insulin therapy, check your blood glucose at least 4 times a day. Occasionally, you may also need to check between 2:00 a.m. and 3:00 a.m., as directed.  Also check your blood glucose: ? Before and after exercise. ? Before potentially dangerous tasks, like driving or using heavy machinery.  You may need to check your blood glucose more often if: ? Your medicine is being adjusted. ? Your diabetes is not well-controlled. ? You are ill. What is a blood glucose log?  A blood glucose log is a record of your blood glucose readings. It helps you and your health care provider: ? Look for patterns in your blood glucose over time. ? Adjust your diabetes management plan as needed.  Every time you check your blood glucose, write down your result and notes about things that may be affecting your blood glucose, such as your diet and exercise for the day.  Most glucose meters store a record of glucose readings in the meter. Some meters allow you to download your records to a computer. How do I check my blood glucose? Follow these steps to get accurate   readings of your blood glucose: Supplies needed   Blood glucose meter.  Test strips for your meter. Each meter has its own strips. You must use the strips that come with your meter.  A needle to prick your finger (lancet). Do not use lancets more than once.  A device that holds the lancet (lancing device).  A journal or log book to write down your results. Procedure  Wash your hands with soap and water.  Prick the side of your finger (not the tip) with the lancet. Use a different finger each time.  Gently rub the finger until a small drop of blood appears.  Follow instructions that come with your meter for inserting the test strip, applying blood to the strip,  and using your blood glucose meter.  Write down your result and any notes. Alternative testing sites  Some meters allow you to use areas of your body other than your finger (alternative sites) to test your blood.  If you think you may have hypoglycemia, or if you have hypoglycemia unawareness, do not use alternative sites. Use your finger instead.  Alternative sites may not be as accurate as the fingers, because blood flow is slower in these areas. This means that the result you get may be delayed, and it may be different from the result that you would get from your finger.  The most common alternative sites are: ? Forearm. ? Thigh. ? Palm of the hand. Additional tips  Always keep your supplies with you.  If you have questions or need help, all blood glucose meters have a 24-hour "hotline" number that you can call. You may also contact your health care provider.  After you use a few boxes of test strips, adjust (calibrate) your blood glucose meter by following instructions that came with your meter. This information is not intended to replace advice given to you by your health care provider. Make sure you discuss any questions you have with your health care provider. Document Released: 04/02/2003 Document Revised: 10/18/2015 Document Reviewed: 09/09/2015 Elsevier Interactive Patient Education  2017 Elsevier Inc.  

## 2016-10-19 ENCOUNTER — Other Ambulatory Visit: Payer: Self-pay | Admitting: *Deleted

## 2016-10-19 DIAGNOSIS — M170 Bilateral primary osteoarthritis of knee: Secondary | ICD-10-CM

## 2016-10-19 NOTE — Telephone Encounter (Signed)
Bilateral knee brace ordered.

## 2016-10-20 ENCOUNTER — Telehealth: Payer: Self-pay | Admitting: *Deleted

## 2016-10-20 ENCOUNTER — Telehealth: Payer: Self-pay | Admitting: Internal Medicine

## 2016-10-20 NOTE — Telephone Encounter (Signed)
[facility]. Per notes, I advised pt of [facility] being [facility] and also advised pt to please not call personal numbers of employees to discuss medical concerns. Pt expressed understanding.

## 2016-10-20 NOTE — Telephone Encounter (Signed)
Thank you :)

## 2016-10-20 NOTE — Telephone Encounter (Signed)
Medical Assistant left message on patient's home and cell voicemail. Voicemail states to give a call back [place] [facility] [phone].  !!!Please inform patient of knee brace prescription being faxed to [facility]!!! Please advise patient to utilize the facilities number [phone] for all medical needs!!! Do not utilize personal numbers to contact staff.

## 2016-10-27 DIAGNOSIS — H5203 Hypermetropia, bilateral: Secondary | ICD-10-CM | POA: Diagnosis not present

## 2016-11-02 MED FILL — $VIAGRA 100 MG TABLET: 100 | 30 days supply | Qty: 10 | Fill #10

## 2016-11-07 DIAGNOSIS — Z0100 Encounter for examination of eyes and vision without abnormal findings: Secondary | ICD-10-CM | POA: Diagnosis not present

## 2016-11-30 MED FILL — $VIAGRA 100 MG TABLET: 100 | 30 days supply | Qty: 10 | Fill #11

## 2017-01-05 MED FILL — $VIAGRA 100 MG TABLET: 100 | 30 days supply | Qty: 10 | Fill #0

## 2017-01-20 ENCOUNTER — Ambulatory Visit: Payer: Medicare HMO | Admitting: Internal Medicine

## 2017-02-01 MED FILL — $VIAGRA 100 MG TABLET: 100 | 30 days supply | Qty: 10 | Fill #1

## 2017-03-08 MED FILL — $VIAGRA 100 MG TABLET: 100 | 30 days supply | Qty: 10 | Fill #2

## 2017-03-24 ENCOUNTER — Other Ambulatory Visit: Payer: Self-pay | Admitting: Internal Medicine

## 2017-03-24 DIAGNOSIS — R202 Paresthesia of skin: Secondary | ICD-10-CM

## 2017-03-24 MED FILL — GABAPENTIN 100 MG CAPSULE: 100 | 90 days supply | Qty: 270 | Fill #0

## 2017-03-25 ENCOUNTER — Other Ambulatory Visit: Payer: Self-pay | Admitting: *Deleted

## 2017-03-25 DIAGNOSIS — R202 Paresthesia of skin: Secondary | ICD-10-CM

## 2017-03-25 MED ORDER — GABAPENTIN 100 MG PO CAPS: 100.0000 mg | ORAL_CAPSULE | Freq: Three times a day (TID) | ORAL | 3 refills | Status: DC

## 2017-03-25 NOTE — Telephone Encounter (Signed)
[facility] to [facility]

## 2017-04-07 MED FILL — $VIAGRA 100 MG TABLET: 100 | 30 days supply | Qty: 10 | Fill #3

## 2017-04-08 ENCOUNTER — Emergency Department (HOSPITAL_COMMUNITY)
Admission: EM | Admit: 2017-04-08 | Discharge: 2017-04-08 | Disposition: A | Discharge status: Home / Self Care | Payer: Medicare HMO | Attending: Emergency Medicine | Admitting: Emergency Medicine

## 2017-04-08 ENCOUNTER — Encounter (HOSPITAL_COMMUNITY): Payer: Self-pay | Admitting: Emergency Medicine

## 2017-04-08 ENCOUNTER — Other Ambulatory Visit: Payer: Self-pay

## 2017-04-08 ENCOUNTER — Emergency Department (HOSPITAL_COMMUNITY): Payer: Medicare HMO

## 2017-04-08 DIAGNOSIS — R0981 Nasal congestion: Secondary | ICD-10-CM | POA: Diagnosis not present

## 2017-04-08 DIAGNOSIS — R05 Cough: Secondary | ICD-10-CM | POA: Diagnosis not present

## 2017-04-08 DIAGNOSIS — J029 Acute pharyngitis, unspecified: Secondary | ICD-10-CM | POA: Diagnosis not present

## 2017-04-08 DIAGNOSIS — E119 Type 2 diabetes mellitus without complications: Secondary | ICD-10-CM | POA: Diagnosis not present

## 2017-04-08 DIAGNOSIS — Z7984 Long term (current) use of oral hypoglycemic drugs: Secondary | ICD-10-CM | POA: Diagnosis not present

## 2017-04-08 DIAGNOSIS — J069 Acute upper respiratory infection, unspecified: Secondary | ICD-10-CM | POA: Diagnosis not present

## 2017-04-08 DIAGNOSIS — Z209 Contact with and (suspected) exposure to unspecified communicable disease: Secondary | ICD-10-CM | POA: Diagnosis not present

## 2017-04-08 MED ORDER — DOXYCYCLINE HYCLATE 100 MG PO CAPS: 100.0000 mg | ORAL_CAPSULE | Freq: Two times a day (BID) | ORAL | 0 refills | Status: DC

## 2017-04-08 MED ORDER — PROMETHAZINE-DM 6.25-15 MG/5ML PO SYRP: 5.0000 mL | ORAL_SOLUTION | Freq: Four times a day (QID) | ORAL | 0 refills | Status: DC | PRN

## 2017-04-08 MED ORDER — FLUTICASONE PROPIONATE 50 MCG/ACT NA SUSP: 1.0000 | Freq: Every day | NASAL | 0 refills | Status: DC

## 2017-04-08 MED ORDER — ACETAMINOPHEN 325 MG PO TABS: 650.0000 mg | ORAL_TABLET | Freq: Four times a day (QID) | ORAL | 0 refills | Status: DC | PRN

## 2017-04-08 NOTE — ED Triage Notes (Signed)
Patient reports persistent productive cough/chest congestion with fever and chills onset this week .

## 2017-04-08 NOTE — ED Provider Notes (Signed)
[facility] Provider Note   CSN: [id number] Arrival date & time: 04/08/17  2022     History   Chief Complaint Chief Complaint  Patient presents with  . Cough    HPI Connor Murray is a 66 y.o. male presenting for evaluation of cough, nasal congestion, and sore throat.  Pt states that for the past week, he has had persistent URI symptoms.  These include nasal congestion, sore throat, and cough.  He reports fevers last night, and feels like his symptoms have gotten worse over the past 2 days.  Cough is productive, worse at night.  He has associated frontal headache and pressure.  He has not taken anything for his symptoms.  He reports a coworker has flulike symptoms, no other sick contacts.  He denies ear pain, eye pain, chest pain, difficulty breathing, nausea, vomiting, or abdominal pain.  He states he has a history of diabetes, but does not check his blood sugars at home.  HPI  Past Medical History:  Diagnosis Date  . Diabetes mellitus without complication [facility](HCC)     Patient Active Problem List   Diagnosis Date Noted  . Dizziness and giddiness 07/08/2016  . Type 2 diabetes mellitus without complication, without long-term current use of insulin (HCC) 08/01/2015  . Left hand paresthesia 05/23/2015  . Viral wart 03/25/2015  . Prediabetes 03/21/2015  . Benign prostatic hyperplasia with urinary frequency 01/04/2015  . ERECTILE DYSFUNCTION 08/20/2014  . Left wrist pain 08/20/2014  . Dyslipidemia 03/02/2013  . Preventative health care 02/03/2013  . HYPERLIPIDEMIA 11/23/2006    History reviewed. No pertinent surgical history.     Home Medications    Prior to Admission medications   Medication Sig Start Date End Date Taking? Authorizing Provider  acetaminophen (TYLENOL) 325 MG tablet Take 2 tablets (650 mg total) by mouth every 6 (six) hours as needed. 04/08/17   Mykale Gandolfo, PA-C  diclofenac sodium (VOLTAREN) 1 % GEL Apply 4 g  topically 4 (four) times daily. 06/17/16   Quentin AngstJegede, Olugbemiga E, MD  doxazosin (CARDURA) 2 MG tablet Take 0.5 tablets (1 mg total) by mouth daily. 06/17/16   Quentin AngstJegede, Olugbemiga E, MD  doxycycline (VIBRAMYCIN) 100 MG capsule Take 1 capsule (100 mg total) by mouth 2 (two) times daily. 04/08/17   Rakeisha Nyce, PA-C  fluticasone (FLONASE) 50 MCG/ACT nasal spray Place 1 spray into both nostrils daily. 04/08/17   Airiana Elman, PA-C  gabapentin (NEURONTIN) 100 MG capsule Take 1 capsule (100 mg total) by mouth 3 (three) times daily. 03/25/17   Quentin AngstJegede, Olugbemiga E, MD  meclizine (ANTIVERT) 25 MG tablet Take 1 tablet (25 mg total) by mouth 3 (three) times daily as needed for dizziness. 07/08/16   Quentin AngstJegede, Olugbemiga E, MD  metFORMIN (GLUCOPHAGE) 500 MG tablet Take 1 tablet (500 mg total) by mouth 2 (two) times daily with a meal. 06/17/16   Jegede, Phylliss Blakeslugbemiga E, MD  omega-3 acid ethyl esters (LOVAZA) 1 g capsule Take 1 capsule (1 g total) by mouth 2 (two) times daily. 06/17/16   Quentin AngstJegede, Olugbemiga E, MD  promethazine-dextromethorphan (PROMETHAZINE-DM) 6.25-15 MG/5ML syrup Take 5 mLs by mouth 4 (four) times daily as needed for cough. 04/08/17   Hersey Maclellan, PA-C  rosuvastatin (CRESTOR) 20 MG tablet Take 1 tablet (20 mg total) by mouth daily. 06/17/16   Quentin AngstJegede, Olugbemiga E, MD  sildenafil (VIAGRA) 100 MG tablet Take 1 tablet (100 mg total) by mouth daily as needed for erectile dysfunction. 06/17/16   Jeanann LewandowskyJegede, Olugbemiga  E, MD    Family History No family history on file.  Social History Social History   Tobacco Use  . Smoking status: Never Smoker  . Smokeless tobacco: Never Used  Substance Use Topics  . Alcohol use: No  . Drug use: No     Allergies   Patient has no known allergies.   Review of Systems Review of Systems  Constitutional: Positive for fever. Negative for chills.  HENT: Positive for congestion, sinus pressure, sinus pain and sore throat.   Respiratory: Positive for cough.  Negative for chest tightness and shortness of breath.   Cardiovascular: Negative for chest pain.  Gastrointestinal: Negative for abdominal pain, nausea and vomiting.     Physical Exam Updated Vital Signs BP (!) 149/69 (BP Location: Right Arm)   Pulse 80   Temp 98.3 F (36.8 C) (Oral)   Resp 18   Ht 5\' 6"  (1.676 m)   Wt 81.6 kg (180 lb)   SpO2 96%   BMI 29.05 kg/m   Physical Exam  Constitutional: He is oriented to person, place, and time. He appears well-developed and well-nourished. No distress.  HENT:  Head: Normocephalic and atraumatic.  Right Ear: Tympanic membrane, external ear and ear canal normal.  Left Ear: Tympanic membrane, external ear and ear canal normal.  Nose: Mucosal edema present. Right sinus exhibits no maxillary sinus tenderness and no frontal sinus tenderness. Left sinus exhibits no maxillary sinus tenderness and no frontal sinus tenderness.  Mouth/Throat: Uvula is midline, oropharynx is clear and moist and mucous membranes are normal. No tonsillar exudate.  Nasal mucosal edema, left worse than right.  OP clear without tonsillar swelling or exudate.  Eyes: Conjunctivae and EOM are normal. Pupils are equal, round, and reactive to light.  Neck: Normal range of motion.  Cardiovascular: Normal rate, regular rhythm and intact distal pulses.  Pulmonary/Chest: Effort normal and breath sounds normal. He has no decreased breath sounds. He has no wheezes. He has no rhonchi. He has no rales.  Pt speaking in full sentences without difficulty.  Clear lung sounds in all fields  Abdominal: Soft. He exhibits no distension. There is no tenderness.  Musculoskeletal: Normal range of motion.  Lymphadenopathy:    He has no cervical adenopathy.  Neurological: He is alert and oriented to person, place, and time.  Skin: Skin is warm.  Psychiatric: He has a normal mood and affect.  Nursing note and vitals reviewed.    ED Treatments / Results  Labs (all labs ordered are listed,  but only abnormal results are displayed) Labs Reviewed - No data to display  EKG  EKG Interpretation None       Radiology Dg Chest 2 View  Result Date: 04/08/2017 CLINICAL DATA:  Cough, chest congestion EXAM: CHEST  2 VIEW COMPARISON:  CTA chest dated 07/29/2015 FINDINGS: Lungs are clear.  No pleural effusion or pneumothorax. Dextrocardia.  The heart is normal in size. Mild degenerative changes of the upper lumbar spine. Cervical spine fixation hardware, incompletely visualized. IMPRESSION: No evidence of acute cardiopulmonary disease. Dextrocardia. Electronically Signed   By: Charline BillsSriyesh  Krishnan M.D.   On: 04/08/2017 22:07    Procedures Procedures (including critical care time)  Medications Ordered in ED Medications - No data to display   Initial Impression / Assessment and Plan / ED Course  I have reviewed the triage vital signs and the nursing notes.  Pertinent labs & imaging results that were available during my care of the patient were reviewed by me and  considered in my medical decision making (see chart for details).     Patient presenting with 1 wk of URI symptoms.  Physical exam reassuring, patient is afebrile and appears nontoxic.  Pulmonary exam reassuring.  Doubt pneumonia, strep, or peritonsillar abscess.  Likely viral URI, however patient high risk as he has diabetes and is 66 years old.  Reports worsening symptoms over the past several days.  Will cover with antibiotics for possible bacterial infection.  Will continue to treat symptomatically.  Patient to follow-up with primary care as needed.  At this time, patient appears safe for discharge.  Return precautions given.  Patient states he understand and agree to plan.   Final Clinical Impressions(s) / ED Diagnoses   Final diagnoses:  Upper respiratory tract infection, unspecified type    ED Discharge Orders        Ordered    fluticasone (FLONASE) 50 MCG/ACT nasal spray  Daily     04/08/17 2255     promethazine-dextromethorphan (PROMETHAZINE-DM) 6.25-15 MG/5ML syrup  4 times daily PRN     04/08/17 2255    doxycycline (VIBRAMYCIN) 100 MG capsule  2 times daily     04/08/17 2255    acetaminophen (TYLENOL) 325 MG tablet  Every 6 hours PRN     04/08/17 2256       Shaliyah Taite, PA-C 04/09/17 0046    Jacalyn Lefevre, MD 04/09/17 1514

## 2017-04-08 NOTE — Discharge Instructions (Signed)
You most likely have a viral illness.  This should be treated symptomatically. Use Tylenol as needed for fevers or body aches. Use Flonase daily for nasal congestion and cough. Take cough syrup as needed. Take antibiotics (doxycycline) as prescribed. Finish the entire course, even if your symptoms improve. Make sure you stay well-hydrated with water. Wash your hands frequently to prevent spread of infection. Follow-up with your primary care doctor in 1 week if your symptoms are not improving. Return to the emergency room if you develop chest pain, difficulty breathing, or any new or worsening symptoms.

## 2017-04-08 NOTE — ED Notes (Signed)
Patient transported to X-ray 

## 2017-04-09 ENCOUNTER — Telehealth: Payer: Self-pay | Admitting: Internal Medicine

## 2017-04-09 ENCOUNTER — Other Ambulatory Visit: Payer: Self-pay | Admitting: Pharmacist

## 2017-04-09 MED ORDER — PNEUMOCOCCAL 13-VAL CONJ VACC IM SUSP: 0.5000 mL | INTRAMUSCULAR | 0 refills | Status: AC

## 2017-04-09 MED FILL — PROMETHAZINE-DM SYRUP: 6.25-15 | 6 days supply | Qty: 118 | Fill #0

## 2017-04-09 MED FILL — FLUTICASONE PROP 50 MCG SPR: 50 | 60 days supply | Qty: 16 | Fill #0

## 2017-04-09 MED FILL — PREVNAR 13 SYRINGE: 1 days supply | Qty: 1 | Fill #0

## 2017-04-09 MED FILL — DOXYCYCLINE 100 MG TABLET: 100 | 10 days supply | Qty: 20 | Fill #0

## 2017-04-09 NOTE — Telephone Encounter (Signed)
Patient dropped of results this morning for prostate cancer screening .

## 2017-04-23 DIAGNOSIS — M25532 Pain in left wrist: Secondary | ICD-10-CM | POA: Diagnosis not present

## 2017-04-23 DIAGNOSIS — G5602 Carpal tunnel syndrome, left upper limb: Secondary | ICD-10-CM | POA: Diagnosis not present

## 2017-04-23 DIAGNOSIS — M4722 Other spondylosis with radiculopathy, cervical region: Secondary | ICD-10-CM | POA: Diagnosis not present

## 2017-04-25 IMAGING — CR DG WRIST COMPLETE 3+V*L*
4 series · 4 of 4 positions shown · non-contrast
Comparison: None.

CLINICAL DATA: Left wrist pain

EXAM:
LEFT WRIST - COMPLETE 3+ VIEW

[wrist pa]
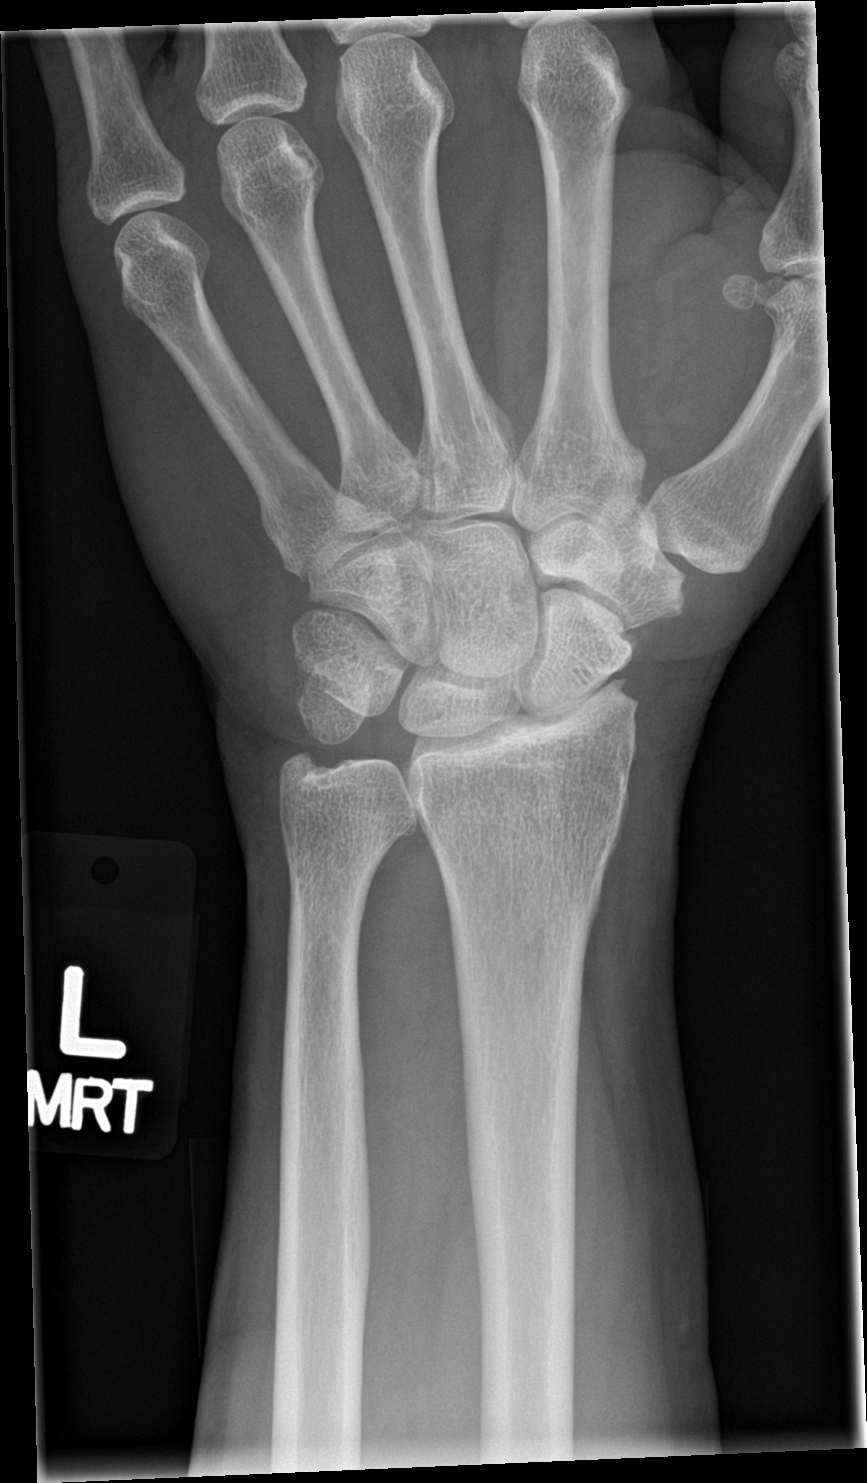

[wrist obl]
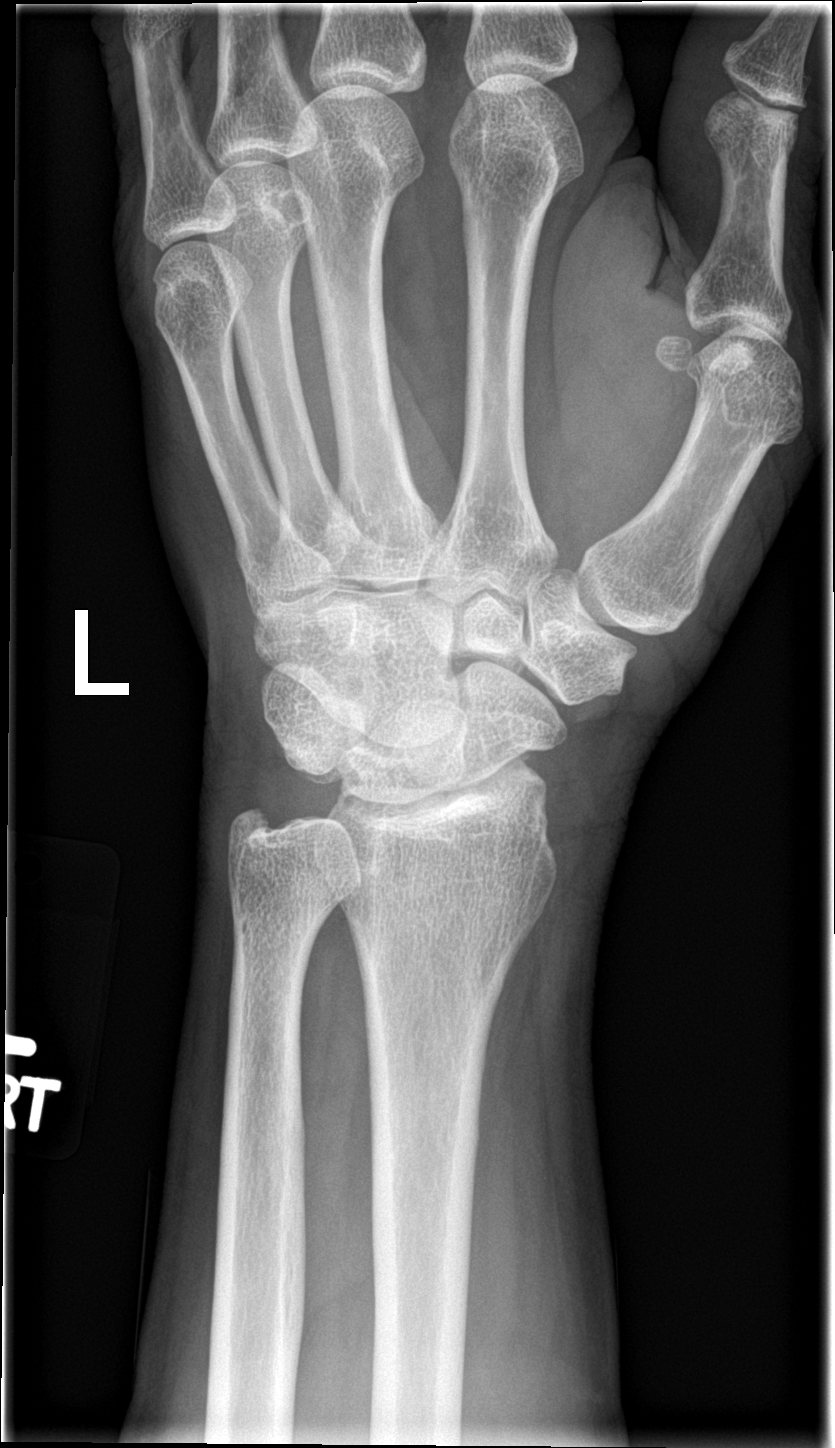

[wrist lat]
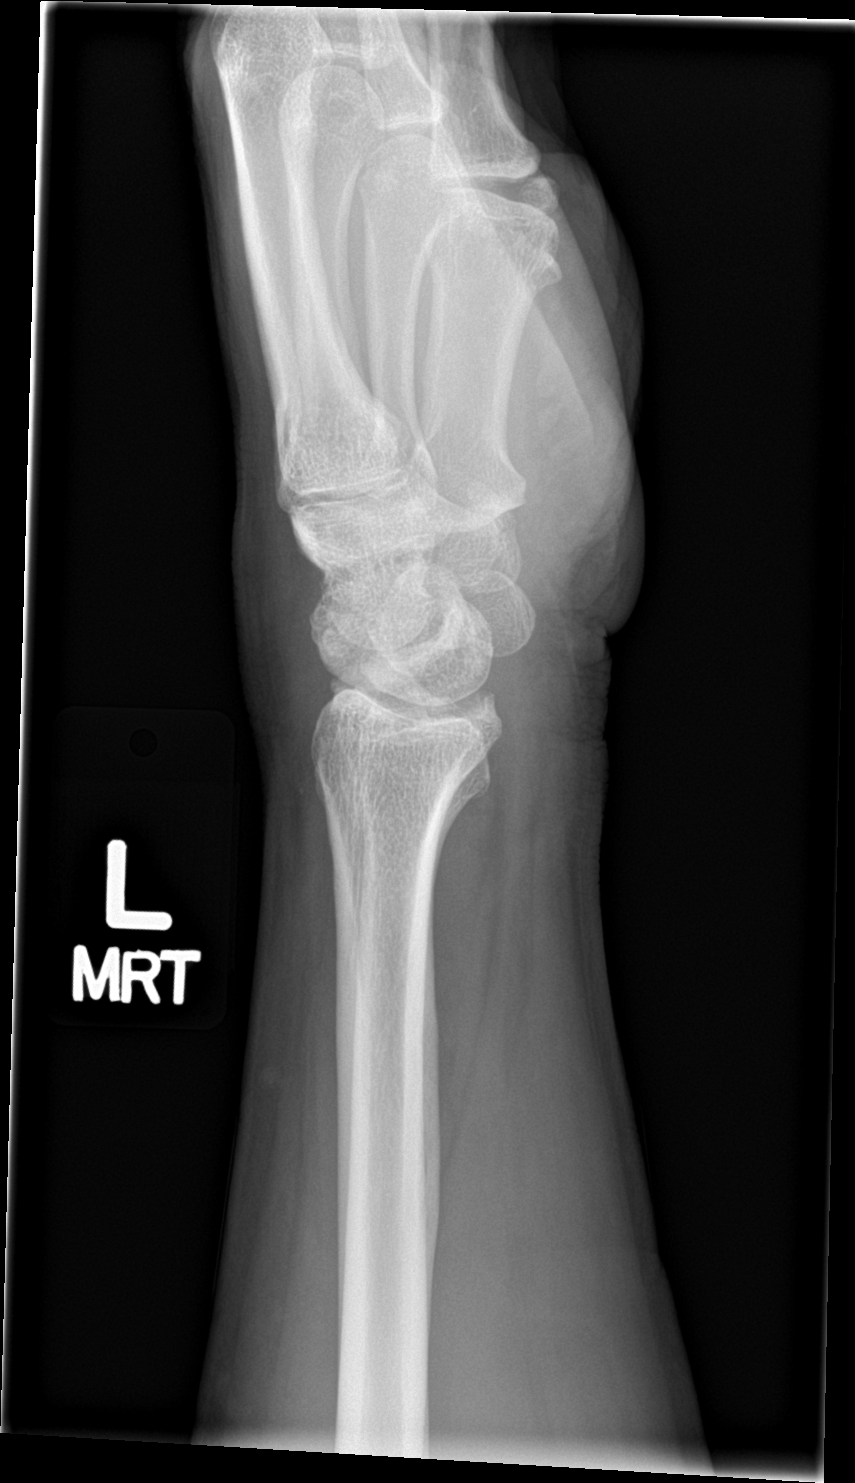

[wrist navicular]
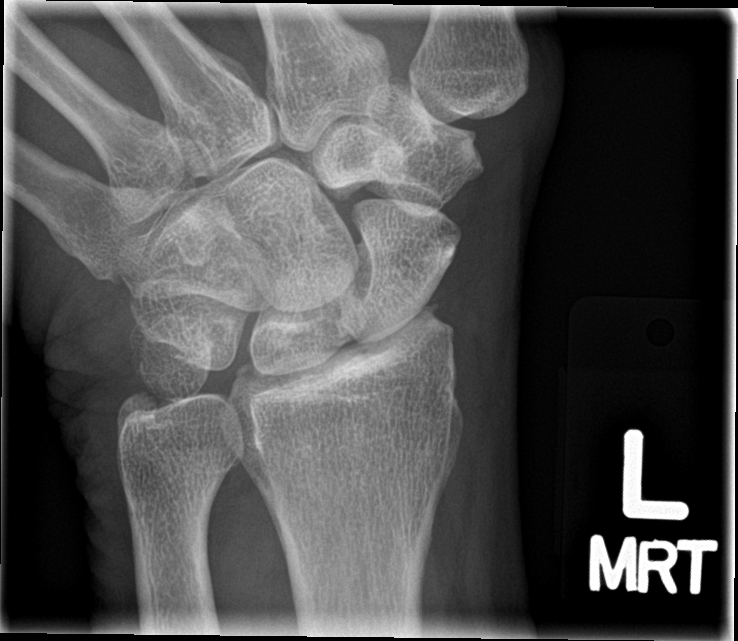

[4 of 4 positions shown; findings below may reference images not displayed]

FINDINGS: Normal alignment no fracture. Moderate joint space narrowing in the
radiocarpal joint with mild spurring of the radial styloid. No
erosion or chondrocalcinosis
IMPRESSION: Radiocarpal degenerative change with cartilage loss. No acute
abnormality.

## 2017-04-28 ENCOUNTER — Encounter: Payer: Self-pay | Admitting: Internal Medicine

## 2017-04-28 ENCOUNTER — Other Ambulatory Visit: Payer: Self-pay | Admitting: Pharmacist

## 2017-04-28 ENCOUNTER — Other Ambulatory Visit: Payer: Self-pay

## 2017-04-28 ENCOUNTER — Ambulatory Visit: Payer: PPO | Attending: Internal Medicine | Admitting: Internal Medicine

## 2017-04-28 VITALS — BP 142/82 | HR 70 | Resp 16 | Ht 66.0 in | Wt 189.4 lb

## 2017-04-28 DIAGNOSIS — N401 Enlarged prostate with lower urinary tract symptoms: Secondary | ICD-10-CM

## 2017-04-28 DIAGNOSIS — L853 Xerosis cutis: Secondary | ICD-10-CM | POA: Insufficient documentation

## 2017-04-28 DIAGNOSIS — F528 Other sexual dysfunction not due to a substance or known physiological condition: Secondary | ICD-10-CM | POA: Diagnosis not present

## 2017-04-28 DIAGNOSIS — N529 Male erectile dysfunction, unspecified: Secondary | ICD-10-CM | POA: Diagnosis not present

## 2017-04-28 DIAGNOSIS — E785 Hyperlipidemia, unspecified: Secondary | ICD-10-CM | POA: Diagnosis not present

## 2017-04-28 DIAGNOSIS — Z79899 Other long term (current) drug therapy: Secondary | ICD-10-CM | POA: Diagnosis not present

## 2017-04-28 DIAGNOSIS — R350 Frequency of micturition: Secondary | ICD-10-CM | POA: Diagnosis not present

## 2017-04-28 DIAGNOSIS — Z7984 Long term (current) use of oral hypoglycemic drugs: Secondary | ICD-10-CM | POA: Insufficient documentation

## 2017-04-28 DIAGNOSIS — E119 Type 2 diabetes mellitus without complications: Secondary | ICD-10-CM | POA: Diagnosis not present

## 2017-04-28 DIAGNOSIS — R202 Paresthesia of skin: Secondary | ICD-10-CM | POA: Insufficient documentation

## 2017-04-28 LAB — GLUCOSE, POCT (MANUAL RESULT ENTRY): POC Glucose: 155 mg/dl — AB (ref 70–99)

## 2017-04-28 LAB — POCT GLYCOSYLATED HEMOGLOBIN (HGB A1C): HEMOGLOBIN A1C: 6.7

## 2017-04-28 MED ORDER — METFORMIN HCL 500 MG PO TABS: 500.0000 mg | ORAL_TABLET | Freq: Two times a day (BID) | ORAL | 3 refills | Status: DC

## 2017-04-28 MED ORDER — ROSUVASTATIN CALCIUM 20 MG PO TABS: 20.0000 mg | ORAL_TABLET | Freq: Every day | ORAL | 3 refills | Status: DC

## 2017-04-28 MED ORDER — SILDENAFIL CITRATE 100 MG PO TABS: 100.0000 mg | ORAL_TABLET | Freq: Every day | ORAL | 3 refills | Status: DC | PRN

## 2017-04-28 MED ORDER — DOXAZOSIN MESYLATE 2 MG PO TABS: 1.0000 mg | ORAL_TABLET | Freq: Every day | ORAL | 2 refills | Status: DC

## 2017-04-28 MED ORDER — PNEUMOCOCCAL VAC POLYVALENT 25 MCG/0.5ML IJ INJ: INJECTION | INTRAMUSCULAR | 0 refills | Status: AC

## 2017-04-28 MED ORDER — GABAPENTIN 300 MG PO CAPS: 300.0000 mg | ORAL_CAPSULE | Freq: Three times a day (TID) | ORAL | 3 refills | Status: DC

## 2017-04-28 MED ORDER — ACETAMINOPHEN-CODEINE #3 300-30 MG PO TABS: 1.0000 | ORAL_TABLET | ORAL | 0 refills | Status: DC | PRN

## 2017-04-28 MED FILL — metFORMIN HCL 500 MG TABS: 500 | 90 days supply | Qty: 180 | Fill #0

## 2017-04-28 MED FILL — PNEUMOVAX 23 VIAL: 25 | 1 days supply | Qty: 1 | Fill #0

## 2017-04-28 MED FILL — ACETAMINOPHEN/COD #3 TABLET: 300-30 | 6 days supply | Qty: 40 | Fill #0

## 2017-04-28 MED FILL — GABAPENTIN 300 MG CAPSULE: 300 | 90 days supply | Qty: 270 | Fill #0

## 2017-04-28 MED FILL — DOXAZOSIN MESYLATE 2 MG TAB: 2 | 90 days supply | Qty: 45 | Fill #0

## 2017-04-28 MED FILL — ROSUVASTATIN CALCIUM 20 MG: 20 | 90 days supply | Qty: 90 | Fill #0

## 2017-04-28 NOTE — Progress Notes (Signed)
F/u DM  Pain in right knee and left wrist (swelling) RF viagra

## 2017-04-28 NOTE — Patient Instructions (Addendum)
Diabetes Mellitus and Nutrition When you have diabetes (diabetes mellitus), it is very important to have healthy eating habits because your blood sugar (glucose) levels are greatly affected by what you eat and drink. Eating healthy foods in the appropriate amounts, at about the same times every day, can help you:  Control your blood glucose.  Lower your risk of heart disease.  Improve your blood pressure.  Reach or maintain a healthy weight.  Every person with diabetes is different, and each person has different needs for a meal plan. Your health care provider may recommend that you work with a diet and nutrition specialist (dietitian) to make a meal plan that is best for you. Your meal plan may vary depending on factors such as:  The calories you need.  The medicines you take.  Your weight.  Your blood glucose, blood pressure, and cholesterol levels.  Your activity level.  Other health conditions you have, such as heart or kidney disease.  How do carbohydrates affect me? Carbohydrates affect your blood glucose level more than any other type of food. Eating carbohydrates naturally increases the amount of glucose in your blood. Carbohydrate counting is a method for keeping track of how many carbohydrates you eat. Counting carbohydrates is important to keep your blood glucose at a healthy level, especially if you use insulin or take certain oral diabetes medicines. It is important to know how many carbohydrates you can safely have in each meal. This is different for every person. Your dietitian can help you calculate how many carbohydrates you should have at each meal and for snack. Foods that contain carbohydrates include:  Bread, cereal, rice, pasta, and crackers.  Potatoes and corn.  Peas, beans, and lentils.  Milk and yogurt.  Fruit and juice.  Desserts, such as cakes, cookies, ice cream, and candy.  How does alcohol affect me? Alcohol can cause a sudden decrease in blood  glucose (hypoglycemia), especially if you use insulin or take certain oral diabetes medicines. Hypoglycemia can be a life-threatening condition. Symptoms of hypoglycemia (sleepiness, dizziness, and confusion) are similar to symptoms of having too much alcohol. If your health care provider says that alcohol is safe for you, follow these guidelines:  Limit alcohol intake to no more than 1 drink per day for nonpregnant women and 2 drinks per day for men. One drink equals 12 oz of beer, 5 oz of wine, or 1 oz of hard liquor.  Do not drink on an empty stomach.  Keep yourself hydrated with water, diet soda, or unsweetened iced tea.  Keep in mind that regular soda, juice, and other mixers may contain a lot of sugar and must be counted as carbohydrates.  What are tips for following this plan? Reading food labels  Start by checking the serving size on the label. The amount of calories, carbohydrates, fats, and other nutrients listed on the label are based on one serving of the food. Many foods contain more than one serving per package.  Check the total grams (g) of carbohydrates in one serving. You can calculate the number of servings of carbohydrates in one serving by dividing the total carbohydrates by 15. For example, if a food has 30 g of total carbohydrates, it would be equal to 2 servings of carbohydrates.  Check the number of grams (g) of saturated and trans fats in one serving. Choose foods that have low or no amount of these fats.  Check the number of milligrams (mg) of sodium in one serving. Most people   should limit total sodium intake to less than 2,300 mg per day.  Always check the nutrition information of foods labeled as "low-fat" or "nonfat". These foods may be higher in added sugar or refined carbohydrates and should be avoided.  Talk to your dietitian to identify your daily goals for nutrients listed on the label. Shopping  Avoid buying canned, premade, or processed foods. These  foods tend to be high in fat, sodium, and added sugar.  Shop around the outside edge of the grocery store. This includes fresh fruits and vegetables, bulk grains, fresh meats, and fresh dairy. Cooking  Use low-heat cooking methods, such as baking, instead of high-heat cooking methods like deep frying.  Cook using healthy oils, such as olive, canola, or sunflower oil.  Avoid cooking with butter, cream, or high-fat meats. Meal planning  Eat meals and snacks regularly, preferably at the same times every day. Avoid going long periods of time without eating.  Eat foods high in fiber, such as fresh fruits, vegetables, beans, and whole grains. Talk to your dietitian about how many servings of carbohydrates you can eat at each meal.  Eat 4-6 ounces of lean protein each day, such as lean meat, chicken, fish, eggs, or tofu. 1 ounce is equal to 1 ounce of meat, chicken, or fish, 1 egg, or 1/4 cup of tofu.  Eat some foods each day that contain healthy fats, such as avocado, nuts, seeds, and fish. Lifestyle   Check your blood glucose regularly.  Exercise at least 30 minutes 5 or more days each week, or as told by your health care provider.  Take medicines as told by your health care provider.  Do not use any products that contain nicotine or tobacco, such as cigarettes and e-cigarettes. If you need help quitting, ask your health care provider.  Work with a Social worker or diabetes educator to identify strategies to manage stress and any emotional and social challenges. What are some questions to ask my health care provider?  Do I need to meet with a diabetes educator?  Do I need to meet with a dietitian?  What number can I call if I have questions?  When are the best times to check my blood glucose? Where to find more information:  American Diabetes Association: diabetes.org/food-and-fitness/food  Academy of Nutrition and Dietetics:  PokerClues.dk  [employer] of Diabetes and Digestive and Kidney Diseases (NIH): ContactWire.be Summary  A healthy meal plan will help you control your blood glucose and maintain a healthy lifestyle.  Working with a diet and nutrition specialist (dietitian) can help you make a meal plan that is best for you.  Keep in mind that carbohydrates and alcohol have immediate effects on your blood glucose levels. It is important to count carbohydrates and to use alcohol carefully. This information is not intended to replace advice given to you by your health care provider. Make sure you discuss any questions you have with your health care provider. Document Released: 12/25/2004 Document Revised: 05/04/2016 Document Reviewed: 05/04/2016 Elsevier Interactive Patient Education  2018 [employer]. Diabetes Mellitus and Exercise Exercising regularly is important for your overall health, especially when you have diabetes (diabetes mellitus). Exercising is not only about losing weight. It has many health benefits, such as increasing muscle strength and bone density and reducing body fat and stress. This leads to improved fitness, flexibility, and endurance, all of which result in better overall health. Exercise has additional benefits for people with diabetes, including:  Reducing appetite.  Helping to lower  and control blood glucose.  Lowering blood pressure.  Helping to control amounts of fatty substances (lipids) in the blood, such as cholesterol and triglycerides.  Helping the body to respond better to insulin (improving insulin sensitivity).  Reducing how much insulin the body needs.  Decreasing the risk for heart disease by: ? Lowering cholesterol and triglyceride levels. ? Increasing the levels of good cholesterol. ? Lowering blood glucose levels.  What is my  activity plan? Your health care provider or certified diabetes educator can help you make a plan for the type and frequency of exercise (activity plan) that works for you. Make sure that you:  Do at least 150 minutes of moderate-intensity or vigorous-intensity exercise each week. This could be brisk walking, biking, or water aerobics. ? Do stretching and strength exercises, such as yoga or weightlifting, at least 2 times a week. ? Spread out your activity over at least 3 days of the week.  Get some form of physical activity every day. ? Do not go more than 2 days in a row without some kind of physical activity. ? Avoid being inactive for more than 90 minutes at a time. Take frequent breaks to walk or stretch.  Choose a type of exercise or activity that you enjoy, and set realistic goals.  Start slowly, and gradually increase the intensity of your exercise over time.  What do I need to know about managing my diabetes?  Check your blood glucose before and after exercising. ? If your blood glucose is higher than 240 mg/dL (40.9 mmol/L) before you exercise, check your urine for ketones. If you have ketones in your urine, do not exercise until your blood glucose returns to normal.  Know the symptoms of low blood glucose (hypoglycemia) and how to treat it. Your risk for hypoglycemia increases during and after exercise. Common symptoms of hypoglycemia can include: ? Hunger. ? Anxiety. ? Sweating and feeling clammy. ? Confusion. ? Dizziness or feeling light-headed. ? Increased heart rate or palpitations. ? Blurry vision. ? Tingling or numbness around the mouth, lips, or tongue. ? Tremors or shakes. ? Irritability.  Keep a rapid-acting carbohydrate snack available before, during, and after exercise to help prevent or treat hypoglycemia.  Avoid injecting insulin into areas of the body that are going to be exercised. For example, avoid injecting insulin into: ? The arms, when playing  tennis. ? The legs, when jogging.  Keep records of your exercise habits. Doing this can help you and your health care provider adjust your diabetes management plan as needed. Write down: ? Food that you eat before and after you exercise. ? Blood glucose levels before and after you exercise. ? The type and amount of exercise you have done. ? When your insulin is expected to peak, if you use insulin. Avoid exercising at times when your insulin is peaking.  When you start a new exercise or activity, work with your health care provider to make sure the activity is safe for you, and to adjust your insulin, medicines, or food intake as needed.  Drink plenty of water while you exercise to prevent dehydration or heat stroke. Drink enough fluid to keep your urine clear or pale yellow. This information is not intended to replace advice given to you by your health care provider. Make sure you discuss any questions you have with your health care provider. Document Released: 06/20/2003 Document Revised: 10/18/2015 Document Reviewed: 09/09/2015 Elsevier Interactive Patient Education  2018 [employer].  Osteoarthritis Osteoarthritis is a type  of arthritis that affects tissue that covers the ends of bones in joints (cartilage). Cartilage acts as a cushion between the bones and helps them move smoothly. Osteoarthritis results when cartilage in the joints gets worn down. Osteoarthritis is sometimes called "wear and tear" arthritis. Osteoarthritis is the most common form of arthritis. It often occurs in older people. It is a condition that gets worse over time (a progressive condition). Joints that are most often affected by this condition are in:  Fingers.  Toes.  Hips.  Knees.  Spine, including neck and lower back.  What are the causes? This condition is caused by age-related wearing down of cartilage that covers the ends of bones. What increases the risk? The following factors may make you more  likely to develop this condition:  Older age.  Being overweight or obese.  Overuse of joints, such as in athletes.  Past injury of a joint.  Past surgery on a joint.  Family history of osteoarthritis.  What are the signs or symptoms? The main symptoms of this condition are pain, swelling, and stiffness in the joint. The joint may lose its shape over time. Small pieces of bone or cartilage may break off and float inside of the joint, which may cause more pain and damage to the joint. Small deposits of bone (osteophytes) may grow on the edges of the joint. Other symptoms may include:  A grating or scraping feeling inside the joint when you move it.  Popping or creaking sounds when you move.  Symptoms may affect one or more joints. Osteoarthritis in a major joint, such as your knee or hip, can make it painful to walk or exercise. If you have osteoarthritis in your hands, you might not be able to grip items, twist your hand, or control small movements of your hands and fingers (fine motor skills). How is this diagnosed? This condition may be diagnosed based on:  Your medical history.  A physical exam.  Your symptoms.  X-rays of the affected joint(s).  Blood tests to rule out other types of arthritis.  How is this treated? There is no cure for this condition, but treatment can help to control pain and improve joint function. Treatment plans may include:  A prescribed exercise program that allows for rest and joint relief. You may work with a physical therapist.  A weight control plan.  Pain relief techniques, such as: ? Applying heat and cold to the joint. ? Electric pulses delivered to nerve endings under the skin (transcutaneous electrical nerve stimulation, or TENS). ? Massage. ? Certain nutritional supplements.  NSAIDs or prescription medicines to help relieve pain.  Medicine to help relieve pain and inflammation (corticosteroids). This can be given by mouth (orally)  or as an injection.  Assistive devices, such as a brace, wrap, splint, specialized glove, or cane.  Surgery, such as: ? An osteotomy. This is done to reposition the bones and relieve pain or to remove loose pieces of bone and cartilage. ? Joint replacement surgery. You may need this surgery if you have very bad (advanced) osteoarthritis.  Follow these instructions at home: Activity  Rest your affected joints as directed by your health care provider.  Do not drive or use heavy machinery while taking prescription pain medicine.  Exercise as directed. Your health care provider or physical therapist may recommend specific types of exercise, such as: ? Strengthening exercises. These are done to strengthen the muscles that support joints that are affected by arthritis. They can be performed  with weights or with exercise bands to add resistance. ? Aerobic activities. These are exercises, such as brisk walking or water aerobics, that get your heart pumping. ? Range-of-motion activities. These keep your joints easy to move. ? Balance and agility exercises. Managing pain, stiffness, and swelling  If directed, apply heat to the affected area as often as told by your health care provider. Use the heat source that your health care provider recommends, such as a moist heat pack or a heating pad. ? If you have a removable assistive device, remove it as told by your health care provider. ? Place a towel between your skin and the heat source. If your health care provider tells you to keep the assistive device on while you apply heat, place a towel between the assistive device and the heat source. ? Leave the heat on for 20-30 minutes. ? Remove the heat if your skin turns bright red. This is especially important if you are unable to feel pain, heat, or cold. You may have a greater risk of getting burned.  If directed, put ice on the affected joint: ? If you have a removable assistive device, remove it as  told by your health care provider. ? Put ice in a plastic bag. ? Place a towel between your skin and the bag. If your health care provider tells you to keep the assistive device on during icing, place a towel between the assistive device and the bag. ? Leave the ice on for 20 minutes, 2-3 times a day. General instructions  Take over-the-counter and prescription medicines only as told by your health care provider.  Maintain a healthy weight. Follow instructions from your health care provider for weight control. These may include dietary restrictions.  Do not use any products that contain nicotine or tobacco, such as cigarettes and e-cigarettes. These can delay bone healing. If you need help quitting, ask your health care provider.  Use assistive devices as directed by your health care provider.  Keep all follow-up visits as told by your health care provider. This is important. Where to find more information:  [employer] of Arthritis and Musculoskeletal and Skin Diseases: www.niams.http://www.myers.net/  [employer] on Aging: https://walker.com/  American College of Rheumatology: www.rheumatology.org Contact a health care provider if:  Your skin turns red.  You develop a rash.  You have pain that gets worse.  You have a fever along with joint or muscle aches. Get help right away if:  You lose a lot of weight.  You suddenly lose your appetite.  You have night sweats. Summary  Osteoarthritis is a type of arthritis that affects tissue covering the ends of bones in joints (cartilage).  This condition is caused by age-related wearing down of cartilage that covers the ends of bones.  The main symptom of this condition is pain, swelling, and stiffness in the joint.  There is no cure for this condition, but treatment can help to control pain and improve joint function. This information is not intended to replace advice given to you by your health care provider. Make sure you discuss  any questions you have with your health care provider. Document Released: 03/30/2005 Document Revised: 12/02/2015 Document Reviewed: 12/02/2015 Elsevier Interactive Patient Education  [employer].

## 2017-04-28 NOTE — Progress Notes (Signed)
Connor Murray, is a 67 y.o. male  NWG:[id number]  [id number]  DOB - 03/19/1951  No chief complaint on file.     Subjective:   Connor Kidasaac Gwinner is a 67 y.o. male with history of type 2 diabetes mellitus, dyslipidemia and BPH who presents here today for a follow up visit and medication refills. Patient is complaining of right knee pain from his old chronic osteoarthritis. He also have left wrist pain from the same process. He complained of dry skin in his lower limbs and requesting dermatology referral. He has had this dry skin for a while and vaseline is not helping. He claims his BS is better controlled, BS ranging between 90 and 130 fasting. Patient has No headache, No chest pain, No abdominal pain - No Nausea, No new weakness tingling or numbness, No Cough - SOB. He requests refill of all his medications including Viagra  No problems updated.  ALLERGIES: No Known Allergies  PAST MEDICAL HISTORY: Past Medical History:  Diagnosis Date  . Diabetes mellitus without complication (HCC)     MEDICATIONS AT HOME: Prior to Admission medications   Medication Sig Start Date End Date Taking? Authorizing Provider  acetaminophen (TYLENOL) 325 MG tablet Take 2 tablets (650 mg total) by mouth every 6 (six) hours as needed. 04/08/17  Yes Caccavale, Sophia, PA-C  diclofenac sodium (VOLTAREN) 1 % GEL Apply 4 g topically 4 (four) times daily. 06/17/16  Yes Quentin AngstJegede, Gjon Letarte E, MD  doxazosin (CARDURA) 2 MG tablet Take 0.5 tablets (1 mg total) by mouth daily. 04/28/17  Yes Quentin AngstJegede, Alicha Raspberry E, MD  gabapentin (NEURONTIN) 300 MG capsule Take 1 capsule (300 mg total) by mouth 3 (three) times daily. 04/28/17  Yes Quentin AngstJegede, Jerrick Farve E, MD  meclizine (ANTIVERT) 25 MG tablet Take 1 tablet (25 mg total) by mouth 3 (three) times daily as needed for dizziness. 07/08/16  Yes Quentin AngstJegede, Harneet Noblett E, MD  metFORMIN (GLUCOPHAGE) 500 MG tablet Take 1 tablet (500 mg total) by mouth 2 (two) times daily with a meal. 04/28/17   Yes Quentin AngstJegede, Annabell Oconnor E, MD  pneumococcal 23 valent vaccine (PNEUMOVAX 23) 25 MCG/0.5ML injection To be administered by the pharmacist 04/28/17  Yes Quentin AngstJegede, Dat Derksen E, MD  rosuvastatin (CRESTOR) 20 MG tablet Take 1 tablet (20 mg total) by mouth daily. 04/28/17  Yes Quentin AngstJegede, Nicolaas Savo E, MD  acetaminophen-codeine (TYLENOL #3) 300-30 MG tablet Take 1 tablet by mouth every 4 (four) hours as needed. 04/28/17   Quentin AngstJegede, Calob Baskette E, MD  omega-3 acid ethyl esters (LOVAZA) 1 g capsule Take 1 capsule (1 g total) by mouth 2 (two) times daily. Patient not taking: Reported on 04/28/2017 06/17/16   Quentin AngstJegede, Vang Kraeger E, MD  sildenafil (VIAGRA) 100 MG tablet Take 1 tablet (100 mg total) by mouth daily as needed for erectile dysfunction. 04/28/17   Quentin AngstJegede, Jefrey Raburn E, MD    Objective:   Vitals:   04/28/17 0847  BP: (!) 142/82  Pulse: 70  Resp: 16  SpO2: 99%  Weight: 189 lb 6.4 oz (85.9 kg)  Height: 5\' 6"  (1.676 m)   Exam General appearance : Awake, alert, not in any distress. Speech Clear. Not toxic looking HEENT: Atraumatic and Normocephalic, pupils equally reactive to light and accomodation Neck: Supple, no JVD. No cervical lymphadenopathy.  Chest: Good air entry bilaterally, no added sounds  CVS: S1 S2 regular, no murmurs.  Abdomen: Bowel sounds present, Non tender and not distended with no gaurding, rigidity or rebound. Extremities: B/L Lower Ext shows no edema, both legs are  warm to touch Neurology: Awake alert, and oriented X 3, CN II-XII intact, Non focal Skin: No Rash  Data Review Lab Results  Component Value Date   HGBA1C 6.7 04/28/2017   HGBA1C 6.1 10/07/2016   HGBA1C 6.6 06/17/2016    Assessment & Plan   1. Type 2 diabetes mellitus without complication, without long-term current use of insulin (HCC)  - Glucose (CBG) - HgB A1c - metFORMIN (GLUCOPHAGE) 500 MG tablet; Take 1 tablet (500 mg total) by mouth 2 (two) times daily with a meal.  Dispense: 180 tablet; Refill: 3  2.  ERECTILE DYSFUNCTION  - sildenafil (VIAGRA) 100 MG tablet; Take 1 tablet (100 mg total) by mouth daily as needed for erectile dysfunction.  Dispense: 30 tablet; Refill: 3  3. Dyslipidemia  - rosuvastatin (CRESTOR) 20 MG tablet; Take 1 tablet (20 mg total) by mouth daily.  Dispense: 90 tablet; Refill: 3 To address this please limit saturated fat to no more than 7% of your calories, limit cholesterol to 200 mg/day, increase fiber and exercise as tolerated. If needed we may add another cholesterol lowering medication to your regimen.   4. Left hand paresthesia Increase Gabapentin to 300 mg tid - gabapentin (NEURONTIN) 300 MG capsule; Take 1 capsule (300 mg total) by mouth 3 (three) times daily.  Dispense: 270 capsule; Refill: 3  5. Benign prostatic hyperplasia with urinary frequency Refill - doxazosin (CARDURA) 2 MG tablet; Take 0.5 tablets (1 mg total) by mouth daily.  Dispense: 45 tablet; Refill: 2  6. Dry skin dermatitis  - Apply Eucerin Cream Bid - Ambulatory referral to Dermatology  Patient have been counseled extensively about nutrition and exercise. Other issues discussed during this visit include: low cholesterol diet, weight control and daily exercise, foot care, annual eye examinations at Ophthalmology, importance of adherence with medications and regular follow-up. We also discussed long term complications of uncontrolled diabetes and hypertension.   Return in about 3 months (around 07/27/2017) for Hemoglobin A1C and Follow up, DM, Follow up HTN, Follow up Pain and comorbidities.  The patient was given clear instructions to go to ER or return to medical center if symptoms don't improve, worsen or new problems develop. The patient verbalized understanding. The patient was told to call to get lab results if they haven't heard anything in the next week.   This note has been created with Education officer, environmental. Any transcriptional errors are  unintentional.    Jeanann Lewandowsky, MD, MHA, Maxwell Caul, CPE [facility] and [facility] [place], [place] [phone]   04/28/2017, 10:02 AM

## 2017-05-11 ENCOUNTER — Other Ambulatory Visit: Payer: Self-pay | Admitting: *Deleted

## 2017-05-11 MED ORDER — EUCERIN EX CREA: TOPICAL_CREAM | CUTANEOUS | 0 refills | Status: AC | PRN

## 2017-05-13 MED FILL — $VIAGRA 100 MG TABLET: 100 | 30 days supply | Qty: 10 | Fill #0

## 2017-05-14 ENCOUNTER — Other Ambulatory Visit: Payer: Self-pay | Admitting: Pharmacist

## 2017-05-14 MED ORDER — INFLUENZA VAC SPLIT QUAD 0.5 ML IM SUSY: 0.5000 mL | PREFILLED_SYRINGE | Freq: Once | INTRAMUSCULAR | 0 refills | Status: AC

## 2017-05-14 MED FILL — FLUARIX QUADRIVALENT 0.5 ML: 0.5 | 1 days supply | Qty: 1 | Fill #0

## 2017-05-31 DIAGNOSIS — M79642 Pain in left hand: Secondary | ICD-10-CM | POA: Diagnosis not present

## 2017-06-08 MED FILL — $VIAGRA 100 MG TABLET: 100 | 30 days supply | Qty: 10 | Fill #1

## 2017-06-09 DIAGNOSIS — M79642 Pain in left hand: Secondary | ICD-10-CM | POA: Diagnosis not present

## 2017-06-14 ENCOUNTER — Ambulatory Visit: Payer: Self-pay | Attending: Internal Medicine

## 2017-06-14 ENCOUNTER — Telehealth: Payer: Self-pay | Admitting: Internal Medicine

## 2017-06-14 NOTE — Telephone Encounter (Signed)
Pt came in to let PCP know that he is going under left wrist surgery to remove a bone on 06/16/2017 at [phone] Please follow up if you have any questions orconcerns

## 2017-06-16 DIAGNOSIS — M79642 Pain in left hand: Secondary | ICD-10-CM | POA: Diagnosis not present

## 2017-06-16 DIAGNOSIS — M13832 Other specified arthritis, left wrist: Secondary | ICD-10-CM | POA: Diagnosis not present

## 2017-06-29 ENCOUNTER — Ambulatory Visit: Payer: PPO | Attending: Internal Medicine | Admitting: *Deleted

## 2017-06-29 DIAGNOSIS — Z111 Encounter for screening for respiratory tuberculosis: Secondary | ICD-10-CM | POA: Diagnosis not present

## 2017-06-29 NOTE — Progress Notes (Signed)
PPD Placement note Connor Murray, 67 y.o. male is here today for placement of PPD test Reason for PPD test: Employment  Pt taken PPD test before: no Verified in allergy area and with patient that they are not allergic to the products PPD is made of (Phenol or Tween). Yes Is patient taking any oral or IV steroid medication now or have they taken it in the last month? no Has the patient ever received the BCG vaccine?: no Has the patient been in recent contact with anyone known or suspected of having active TB disease?: no  O: Alert and oriented in NAD. P:  PPD placed on 06/29/2017.  Patient advised to return for reading within 48-72 hours.

## 2017-07-01 ENCOUNTER — Ambulatory Visit: Payer: PPO | Attending: Internal Medicine | Admitting: *Deleted

## 2017-07-01 DIAGNOSIS — Z111 Encounter for screening for respiratory tuberculosis: Secondary | ICD-10-CM | POA: Diagnosis not present

## 2017-07-01 LAB — TB SKIN TEST
Induration: 0 mm
TB Skin Test: NEGATIVE

## 2017-07-12 MED FILL — $VIAGRA 100 MG TABLET: 100 | 30 days supply | Qty: 10 | Fill #2

## 2017-07-15 DIAGNOSIS — M13132 Monoarthritis, not elsewhere classified, left wrist: Secondary | ICD-10-CM | POA: Diagnosis not present

## 2017-07-15 DIAGNOSIS — G5642 Causalgia of left upper limb: Secondary | ICD-10-CM | POA: Diagnosis not present

## 2017-07-15 DIAGNOSIS — M19032 Primary osteoarthritis, left wrist: Secondary | ICD-10-CM | POA: Diagnosis not present

## 2017-07-15 DIAGNOSIS — G8918 Other acute postprocedural pain: Secondary | ICD-10-CM | POA: Diagnosis not present

## 2017-08-02 DIAGNOSIS — M19032 Primary osteoarthritis, left wrist: Secondary | ICD-10-CM | POA: Diagnosis not present

## 2017-08-02 DIAGNOSIS — M19031 Primary osteoarthritis, right wrist: Secondary | ICD-10-CM | POA: Diagnosis not present

## 2017-08-02 DIAGNOSIS — Z4789 Encounter for other orthopedic aftercare: Secondary | ICD-10-CM | POA: Diagnosis not present

## 2017-08-03 ENCOUNTER — Ambulatory Visit: Payer: PPO | Attending: Internal Medicine | Admitting: Internal Medicine

## 2017-08-03 ENCOUNTER — Encounter: Payer: Self-pay | Admitting: Internal Medicine

## 2017-08-03 VITALS — BP 149/76 | HR 64 | Temp 98.9°F | Resp 16 | Ht 66.0 in | Wt 180.0 lb

## 2017-08-03 DIAGNOSIS — Z79899 Other long term (current) drug therapy: Secondary | ICD-10-CM | POA: Diagnosis not present

## 2017-08-03 DIAGNOSIS — N401 Enlarged prostate with lower urinary tract symptoms: Secondary | ICD-10-CM

## 2017-08-03 DIAGNOSIS — E119 Type 2 diabetes mellitus without complications: Secondary | ICD-10-CM | POA: Diagnosis not present

## 2017-08-03 DIAGNOSIS — N529 Male erectile dysfunction, unspecified: Secondary | ICD-10-CM | POA: Insufficient documentation

## 2017-08-03 DIAGNOSIS — M79609 Pain in unspecified limb: Secondary | ICD-10-CM

## 2017-08-03 DIAGNOSIS — Z7984 Long term (current) use of oral hypoglycemic drugs: Secondary | ICD-10-CM | POA: Insufficient documentation

## 2017-08-03 DIAGNOSIS — N400 Enlarged prostate without lower urinary tract symptoms: Secondary | ICD-10-CM | POA: Diagnosis not present

## 2017-08-03 DIAGNOSIS — R350 Frequency of micturition: Secondary | ICD-10-CM | POA: Diagnosis not present

## 2017-08-03 DIAGNOSIS — F528 Other sexual dysfunction not due to a substance or known physiological condition: Secondary | ICD-10-CM | POA: Diagnosis not present

## 2017-08-03 DIAGNOSIS — I10 Essential (primary) hypertension: Secondary | ICD-10-CM | POA: Diagnosis not present

## 2017-08-03 LAB — GLUCOSE, POCT (MANUAL RESULT ENTRY): POC GLUCOSE: 100 mg/dL — AB (ref 70–99)

## 2017-08-03 LAB — POCT GLYCOSYLATED HEMOGLOBIN (HGB A1C): HEMOGLOBIN A1C: 6.6

## 2017-08-03 MED ORDER — DOXAZOSIN MESYLATE 2 MG PO TABS: 2.0000 mg | ORAL_TABLET | Freq: Every day | ORAL | 2 refills | Status: AC

## 2017-08-03 MED ORDER — ACETAMINOPHEN 325 MG PO TABS: 650.0000 mg | ORAL_TABLET | Freq: Three times a day (TID) | ORAL | 1 refills | Status: AC | PRN

## 2017-08-03 MED ORDER — SILDENAFIL CITRATE 100 MG PO TABS: 100.0000 mg | ORAL_TABLET | Freq: Every day | ORAL | 3 refills | Status: AC | PRN

## 2017-08-03 MED FILL — DOXAZOSIN MESYLATE 2 MG TAB: 2 | 30 days supply | Qty: 30 | Fill #0

## 2017-08-03 NOTE — Progress Notes (Signed)
Pt states he has been in pain the last 2-3 weeks since having surgery

## 2017-08-03 NOTE — Progress Notes (Signed)
Patient ID: Connor Murray, male    DOB: 23-Nov-1950  MRN: [id number]  CC: re-establish and Diabetes   Subjective: Connor Murray is a 67 y.o. male who presents for chronic disease management and to establish with me as PCP. Wife, Claude Manges, is with him. His concerns today include:   Patient with history of DM, HL, BPH, OA of the knee, ED  1.  DM:  Does no check BS regularly Diet: doing good with eating habits. Drinks mainly water Exercise: 3 x a wk by walking Last eye exam was about 6 mths ago.  New glasses No numbness in feet  2.  Wearing cast LT hand and forearm.  Had surgery 07/15/2017 on wrist for advance OA.  Saw surgeon in f/u yesterday. Cast for 2 wks Allowed by [facility]  3.  HL: compliant with Crestor  4.  Pain posterior RT knee x 6 mths.  Worse with walking and when about to stand up and flexion of the knee -no pain in muscle with walking  5.  ED:  Needs RF on Viagra Patient Active Problem List   Diagnosis Date Noted  . Dizziness and giddiness 07/08/2016  . Type 2 diabetes mellitus without complication, without long-term current use of insulin (HCC) 08/01/2015  . Left hand paresthesia 05/23/2015  . Viral wart 03/25/2015  . Prediabetes 03/21/2015  . Benign prostatic hyperplasia with urinary frequency 01/04/2015  . ERECTILE DYSFUNCTION 08/20/2014  . Left wrist pain 08/20/2014  . Dyslipidemia 03/02/2013  . Preventative health care 02/03/2013  . HYPERLIPIDEMIA 11/23/2006     Current Outpatient Medications on File Prior to Visit  Medication Sig Dispense Refill  . gabapentin (NEURONTIN) 300 MG capsule Take 1 capsule (300 mg total) by mouth 3 (three) times daily. 270 capsule 3  . meclizine (ANTIVERT) 25 MG tablet Take 1 tablet (25 mg total) by mouth 3 (three) times daily as needed for dizziness. 30 tablet 0  . metFORMIN (GLUCOPHAGE) 500 MG tablet Take 1 tablet (500 mg total) by mouth 2 (two) times daily with a meal. 180 tablet 3  . omega-3 acid ethyl esters (LOVAZA) 1 g  capsule Take 1 capsule (1 g total) by mouth 2 (two) times daily. (Patient not taking: Reported on 04/28/2017) 180 capsule 3  . pneumococcal 23 valent vaccine (PNEUMOVAX 23) 25 MCG/0.5ML injection To be administered by the pharmacist 0.5 mL 0  . rosuvastatin (CRESTOR) 20 MG tablet Take 1 tablet (20 mg total) by mouth daily. 90 tablet 3  . Skin Protectants, Misc. (EUCERIN) cream Apply topically as needed for dry skin. 454 g 0   No current facility-administered medications on file prior to visit.     No Known Allergies  Social History   Socioeconomic History  . Marital status: Divorced    Spouse name: Not on file  . Number of children: Not on file  . Years of education: Not on file  . Highest education level: Not on file  Occupational History  . Occupation: Scientist, physiological  Social Needs  . Financial resource strain: Not on file  . Food insecurity:    Worry: Not on file    Inability: Not on file  . Transportation needs:    Medical: Not on file    Non-medical: Not on file  Tobacco Use  . Smoking status: Never Smoker  . Smokeless tobacco: Never Used  Substance and Sexual Activity  . Alcohol use: No  . Drug use: No  . Sexual activity: Yes  Lifestyle  .  Physical activity:    Days per week: Not on file    Minutes per session: Not on file  . Stress: Not on file  Relationships  . Social connections:    Talks on phone: Not on file    Gets together: Not on file    Attends religious service: Not on file    Active member of club or organization: Not on file    Attends meetings of clubs or organizations: Not on file    Relationship status: Not on file  . Intimate partner violence:    Fear of current or ex partner: Not on file    Emotionally abused: Not on file    Physically abused: Not on file    Forced sexual activity: Not on file  Other Topics Concern  . Not on file  Social History Narrative  . Not on file    No family history on file.  No past surgical history on  file.  ROS: Review of Systems Negative except as stated above Depression noted to be elevated today.  On review of blood pressure readings in epic it seems his blood pressure has been steadily increasing over the past several months.  Wife reports that he loves using table salt. PHYSICAL EXAM: BP (!) 149/76   Pulse 64   Temp 98.9 F (37.2 C) (Oral)   Resp 16   Ht 5\' 6"  (1.676 m)   Wt 180 lb (81.6 kg)   SpO2 96%   BMI 29.05 kg/m   Wt Readings from Last 3 Encounters:  08/03/17 180 lb (81.6 kg)  04/28/17 189 lb 6.4 oz (85.9 kg)  04/08/17 180 lb (81.6 kg)   Peak blood pressure BP 150/86  Physical Exam   General appearance - alert, well appearing, and in no distress Mental status - alert, oriented to person, place, and time, normal mood, behavior, speech, dress, motor activity, and thought processes Eyes - pupils equal and reactive, extraocular eye movements intact Mouth - mucous membranes moist, pharynx normal without lesions Neck - supple, no significant adenopathy Chest - clear to auscultation, no wheezes, rales or rhonchi, symmetric air entry Heart - normal rate, regular rhythm, normal S1, S2, no murmurs, rubs, clicks or gallops Musculoskeletal -knees: Good range of motion.  No tenderness on palpation of the right popliteal area. Extremities -lower extremity edema.  Dorsalis pedis, posterior tibialis and popliteal pulses 3+ bilaterally Diabetic Foot Exam - Simple   Simple Foot Form Visual Inspection See comments:  Yes Sensation Testing Intact to touch and monofilament testing bilaterally:  Yes Pulse Check Posterior Tibialis and Dorsalis pulse intact bilaterally:  Yes Comments Dry skin on soles of feet.  Flat footed     BS 100/A1C 6.6  ASSESSMENT AND PLAN: 1. Type 2 diabetes mellitus without complication, without long-term current use of insulin (HCC) At goal.  Continue current dose of metformin.  Continue healthy eating habits and regular exercise. - POCT glucose  (manual entry) - POCT glycosylated hemoglobin (Hb A1C) - Microalbumin / creatinine urine ratio - CBC - Comprehensive metabolic panel - Lipid panel  2. Benign prostatic hyperplasia with urinary frequency Given his elevated blood pressure today I recommend increasing the Cardura to 2 mg daily - doxazosin (CARDURA) 2 MG tablet; Take 1 tablet (2 mg total) by mouth daily.  Dispense: 90 tablet; Refill: 2  3. ERECTILE DYSFUNCTION - sildenafil (VIAGRA) 100 MG tablet; Take 1 tablet (100 mg total) by mouth daily as needed for erectile dysfunction.  Dispense: 30 tablet; Refill:  3  4. Essential hypertension Increase Cardura to 2 mg daily.  Nurse only blood pressure check in a few weeks.  If still elevated we will add lisinopril - doxazosin (CARDURA) 2 MG tablet; Take 1 tablet (2 mg total) by mouth daily.  Dispense: 90 tablet; Refill: 2  5. Popliteal pain This is chronic.  Exam is unrevealing.  We will have him use Tylenol as needed.  However if it persists we will have him see orthopedics - acetaminophen (TYLENOL) 325 MG tablet; Take 2 tablets (650 mg total) by mouth every 8 (eight) hours as needed.  Dispense: 60 tablet; Refill: 1   Patient was given the opportunity to ask questions.  Patient verbalized understanding of the plan and was able to repeat key elements of the plan.   Orders Placed This Encounter  Procedures  . Microalbumin / creatinine urine ratio  . CBC  . Comprehensive metabolic panel  . Lipid panel  . POCT glucose (manual entry)  . POCT glycosylated hemoglobin (Hb A1C)     Requested Prescriptions   Signed Prescriptions Disp Refills  . doxazosin (CARDURA) 2 MG tablet 90 tablet 2    Sig: Take 1 tablet (2 mg total) by mouth daily.  . sildenafil (VIAGRA) 100 MG tablet 30 tablet 3    Sig: Take 1 tablet (100 mg total) by mouth daily as needed for erectile dysfunction.  Marland Kitchen. acetaminophen (TYLENOL) 325 MG tablet 60 tablet 1    Sig: Take 2 tablets (650 mg total) by mouth every 8  (eight) hours as needed.    Return in about 3 months (around 11/02/2017).  Jonah Blueeborah Gretel Cantu, MD, FACP

## 2017-08-03 NOTE — Patient Instructions (Signed)
Nurse only blood pressure check in 3 weeks.  Increase Doxazosin to 2 mg daily.    Cut back on the amount of salt you use in your food.

## 2017-08-04 LAB — LIPID PANEL
CHOL/HDL RATIO: 3.3 ratio (ref 0.0–5.0)
Cholesterol, Total: 147 mg/dL (ref 100–199)
HDL: 45 mg/dL (ref >39)
LDL Calculated: 84 mg/dL (ref 0–99)
Triglycerides: 90 mg/dL (ref 0–149)
VLDL CHOLESTEROL CAL: 18 mg/dL (ref 5–40)

## 2017-08-04 LAB — COMPREHENSIVE METABOLIC PANEL
A/G RATIO: 1.2 (ref 1.2–2.2)
ALBUMIN: 4.4 g/dL (ref 3.6–4.8)
ALT: 33 IU/L (ref 0–44)
AST: 29 IU/L (ref 0–40)
Alkaline Phosphatase: 57 IU/L (ref 39–117)
BILIRUBIN TOTAL: 0.4 mg/dL (ref 0.0–1.2)
BUN / CREAT RATIO: 16 (ref 10–24)
BUN: 14 mg/dL (ref 8–27)
CALCIUM: 10.2 mg/dL (ref 8.6–10.2)
CHLORIDE: 101 mmol/L (ref 96–106)
CO2: 22 mmol/L (ref 20–29)
Creatinine, Ser: 0.87 mg/dL (ref 0.76–1.27)
GFR, EST AFRICAN AMERICAN: 103 mL/min/{1.73_m2} (ref >59)
GFR, EST NON AFRICAN AMERICAN: 89 mL/min/{1.73_m2} (ref >59)
Globulin, Total: 3.6 g/dL (ref 1.5–4.5)
Glucose: 91 mg/dL (ref 65–99)
POTASSIUM: 4.3 mmol/L (ref 3.5–5.2)
Sodium: 141 mmol/L (ref 134–144)
TOTAL PROTEIN: 8 g/dL (ref 6.0–8.5)

## 2017-08-04 LAB — CBC
HEMATOCRIT: 41.4 % (ref 37.5–51.0)
HEMOGLOBIN: 14 g/dL (ref 13.0–17.7)
MCH: 30.6 pg (ref 26.6–33.0)
MCHC: 33.8 g/dL (ref 31.5–35.7)
MCV: 90 fL (ref 79–97)
Platelets: 301 10*3/uL (ref 150–379)
RBC: 4.58 x10E6/uL (ref 4.14–5.80)
RDW: 14.2 % (ref 12.3–15.4)
WBC: 7.2 10*3/uL (ref 3.4–10.8)

## 2017-08-04 LAB — MICROALBUMIN / CREATININE URINE RATIO
CREATININE, UR: 282.6 mg/dL
Microalb/Creat Ratio: 6.2 mg/g creat (ref 0.0–30.0)
Microalbumin, Urine: 17.6 ug/mL

## 2017-08-10 MED FILL — $VIAGRA 100 MG TABLET: 100 | 30 days supply | Qty: 10 | Fill #3

## 2017-08-12 DIAGNOSIS — M19032 Primary osteoarthritis, left wrist: Secondary | ICD-10-CM | POA: Diagnosis not present

## 2017-08-12 DIAGNOSIS — M79642 Pain in left hand: Secondary | ICD-10-CM | POA: Diagnosis not present

## 2017-08-12 DIAGNOSIS — Z4789 Encounter for other orthopedic aftercare: Secondary | ICD-10-CM | POA: Diagnosis not present

## 2017-08-18 DIAGNOSIS — M79642 Pain in left hand: Secondary | ICD-10-CM | POA: Diagnosis not present

## 2017-09-03 MED FILL — $VIAGRA 100 MG TABLET: 100 | 30 days supply | Qty: 10 | Fill #4

## 2017-10-04 MED FILL — $VIAGRA 100 MG TABLET: 100 | 30 days supply | Qty: 10 | Fill #5

## 2017-10-26 ENCOUNTER — Ambulatory Visit: Payer: PPO | Attending: Internal Medicine | Admitting: Internal Medicine

## 2017-10-26 ENCOUNTER — Encounter: Payer: Self-pay | Admitting: Internal Medicine

## 2017-10-26 VITALS — BP 174/75 | HR 58 | Temp 98.0°F | Resp 16 | Wt 180.6 lb

## 2017-10-26 DIAGNOSIS — E119 Type 2 diabetes mellitus without complications: Secondary | ICD-10-CM | POA: Diagnosis not present

## 2017-10-26 DIAGNOSIS — E785 Hyperlipidemia, unspecified: Secondary | ICD-10-CM | POA: Insufficient documentation

## 2017-10-26 DIAGNOSIS — N401 Enlarged prostate with lower urinary tract symptoms: Secondary | ICD-10-CM | POA: Diagnosis not present

## 2017-10-26 DIAGNOSIS — I10 Essential (primary) hypertension: Secondary | ICD-10-CM | POA: Diagnosis not present

## 2017-10-26 DIAGNOSIS — Z7984 Long term (current) use of oral hypoglycemic drugs: Secondary | ICD-10-CM | POA: Insufficient documentation

## 2017-10-26 DIAGNOSIS — R350 Frequency of micturition: Secondary | ICD-10-CM | POA: Insufficient documentation

## 2017-10-26 LAB — GLUCOSE, POCT (MANUAL RESULT ENTRY): POC Glucose: 108 mg/dl — AB (ref 70–99)

## 2017-10-26 MED ORDER — ROSUVASTATIN CALCIUM 20 MG PO TABS: 20.0000 mg | ORAL_TABLET | Freq: Every day | ORAL | 3 refills | Status: DC

## 2017-10-26 MED ORDER — LISINOPRIL 5 MG PO TABS: 5.0000 mg | ORAL_TABLET | Freq: Every day | ORAL | 3 refills | Status: DC

## 2017-10-26 MED FILL — GABAPENTIN 300 MG CAPSULE: 300 | 90 days supply | Qty: 270 | Fill #1

## 2017-10-26 MED FILL — DOXAZOSIN MESYLATE 2 MG TAB: 2 | 90 days supply | Qty: 45 | Fill #1

## 2017-10-26 MED FILL — ROSUVASTATIN CALCIUM 20 MG: 20 | 90 days supply | Qty: 90 | Fill #0

## 2017-10-26 MED FILL — LISINOPRIL 5 MG TAB: 5 | 90 days supply | Qty: 90 | Fill #0

## 2017-10-26 MED FILL — metFORMIN HCL 500 MG TABS: 500 | 90 days supply | Qty: 180 | Fill #1

## 2017-10-26 NOTE — Patient Instructions (Signed)
Try to get in 7-8 hours of sleep every day.  Try to limit the salt in the foods.  You can try other seasonings like Ms. DASH.  We have added a new blood pressure medication called Lisinopril 5 mg daily.

## 2017-10-26 NOTE — Progress Notes (Signed)
Patient ID: Connor Murray, male    DOB: 05/06/1950  MRN: [id number]  CC: Diabetes and Hypertension   Subjective: Connor Murray is a 67 y.o. male who presents for chronic ds management His concerns today include:  Patient with history of DM, HL, BPH, OA of the knee, ED, elev BP  Elevated BP: Blood pressure noted to be elevated on last visit.  He is on Cardura for BPH.  This was increased to 2 mg daily.  He has been doing this.  He thinks his blood pressure may be elevated due to inadequate sleep.  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  When he gets home he is unable to sleep a full 8 hours because his wife or children have to be transported somewhere.  He tries to limit salt in the foods.  DM:  Not checking blood sugars.  Reports compliance with metformin.  He avoids sugary drinks and junk foods. A1c on last visit was less than 7. 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  Patient Active Problem List   Diagnosis Date Noted  . Dizziness and giddiness 07/08/2016  . Type 2 diabetes mellitus without complication, without long-term current use of insulin (HCC) 08/01/2015  . Left hand paresthesia 05/23/2015  . Viral wart 03/25/2015  . Prediabetes 03/21/2015  . Benign prostatic hyperplasia with urinary frequency 01/04/2015  . ERECTILE DYSFUNCTION 08/20/2014  . Left wrist pain 08/20/2014  . Dyslipidemia 03/02/2013  . Preventative health care 02/03/2013  . HYPERLIPIDEMIA 11/23/2006     Current Outpatient Medications on File Prior to Visit  Medication Sig Dispense Refill  . acetaminophen (TYLENOL) 325 MG tablet Take 2 tablets (650 mg total) by mouth every 8 (eight) hours as needed. 60 tablet 1  . doxazosin (CARDURA) 2 MG tablet Take 1 tablet (2 mg total) by mouth daily. 90 tablet 2  . gabapentin (NEURONTIN) 300 MG capsule Take 1 capsule (300 mg total) by mouth 3 (three) times daily. 270 capsule 3  . metFORMIN (GLUCOPHAGE) 500 MG tablet Take 1 tablet  (500 mg total) by mouth 2 (two) times daily with a meal. 180 tablet 3  . omega-3 acid ethyl esters (LOVAZA) 1 g capsule Take 1 capsule (1 g total) by mouth 2 (two) times daily. (Patient not taking: Reported on 04/28/2017) 180 capsule 3  . pneumococcal 23 valent vaccine (PNEUMOVAX 23) 25 MCG/0.5ML injection To be administered by the pharmacist 0.5 mL 0  . sildenafil (VIAGRA) 100 MG tablet Take 1 tablet (100 mg total) by mouth daily as needed for erectile dysfunction. 30 tablet 3  . Skin Protectants, Misc. (EUCERIN) cream Apply topically as needed for dry skin. 454 g 0   No current facility-administered medications on file prior to visit.     No Known Allergies  Social History   Socioeconomic History  . Marital status: Divorced    Spouse name: Not on file  . Number of children: Not on file  . Years of education: Not on file  . Highest education level: Not on file  Occupational History  . Occupation: Scientist, physiologicalcab driver  Social Needs  . Financial resource strain: Not on file  . Food insecurity:    Worry: Not on file    Inability: Not on file  . Transportation needs:    Medical: Not on file    Non-medical: Not on file  Tobacco Use  . Smoking status: Never Smoker  . Smokeless tobacco: Never Used  Substance and Sexual Activity  . Alcohol use: No  . Drug use: No  . Sexual activity: Yes  Lifestyle  . Physical activity:    Days per week: Not on file    Minutes per session: Not on file  . Stress: Not on file  Relationships  . Social connections:    Talks on phone: Not on file    Gets together: Not on file    Attends religious service: Not on file    Active member of club or organization: Not on file    Attends meetings of clubs or organizations: Not on file    Relationship status: Not on file  . Intimate partner violence:    Fear of current or ex partner: Not on file    Emotionally abused: Not on file    Physically abused: Not on file    Forced sexual activity: Not on file  Other  Topics Concern  . Not on file  Social History Narrative  . Not on file    No family history on file.  No past surgical history on file.  ROS: Review of Systems Negative except as stated above. PHYSICAL EXAM: BP (!) 174/75   Pulse (!) 58   Temp 98 F (36.7 C) (Oral)   Resp 16   Wt 180 lb 9.6 oz (81.9 kg)   SpO2 95%   BMI 29.15 kg/m   Repeat BP 140/80 Physical Exam  General appearance - alert, well appearing, and in no distress Mental status - normal mood, behavior, speech, dress, motor activity, and thought processes Eyes - pupils equal and reactive, extraocular eye movements intact.  Positive arcus senilis Mouth - mucous membranes moist, pharynx normal without lesions Neck - supple, no significant adenopathy Chest - clear to auscultation, no wheezes, rales or rhonchi, symmetric air entry Heart - normal rate, regular rhythm, normal S1, S2, no murmurs, rubs, clicks or gallops Extremities - peripheral pulses normal, no pedal edema, no clubbing or cyanosis  Results for orders placed or performed in visit on 10/26/17  POCT glucose (manual entry)  Result Value Ref Range   POC Glucose 108 (A) 70 - 99 mg/dl     Chemistry      Component Value Date/Time   NA 141 08/03/2017 1510   K 4.3 08/03/2017 1510   CL 101 08/03/2017 1510   CO2 22 08/03/2017 1510   BUN 14 08/03/2017 1510   CREATININE 0.87 08/03/2017 1510   CREATININE 0.95 06/17/2016 1451      Component Value Date/Time   CALCIUM 10.2 08/03/2017 1510   ALKPHOS 57 08/03/2017 1510   AST 29 08/03/2017 1510   ALT 33 08/03/2017 1510   BILITOT 0.4 08/03/2017 1510     Lab Results  Component Value Date   WBC 7.2 08/03/2017   HGB 14.0 08/03/2017   HCT 41.4 08/03/2017   MCV 90 08/03/2017   PLT 301 08/03/2017    ASSESSMENT AND PLAN: 1. Type 2 diabetes mellitus without complication, without long-term current use of insulin (HCC) Continue to encourage healthy eating habits and regular exercise as he has been doing.   He will continue metformin. - POCT glucose (manual entry)  2. Dyslipidemia - rosuvastatin (CRESTOR) 20 MG tablet; Take 1 tablet (20 mg total) by mouth daily.  Dispense: 90 tablet; Refill: 3  3. Essential hypertension Blood pressure still above goal.  We will add low-dose of lisinopril. Encourage patient to try to get in at least 8 hours of sleep each night. Low-salt diet. -  lisinopril (PRINIVIL,ZESTRIL) 5 MG tablet; Take 1 tablet (5 mg total) by mouth daily.  Dispense: 90 tablet; Refill: 3  Patient was given the opportunity to ask questions.  Patient verbalized understanding of the plan and was able to repeat key elements of the plan.   Orders Placed This Encounter  Procedures  . POCT glucose (manual entry)     Requested Prescriptions   Signed Prescriptions Disp Refills  . lisinopril (PRINIVIL,ZESTRIL) 5 MG tablet 90 tablet 3    Sig: Take 1 tablet (5 mg total) by mouth daily.  . rosuvastatin (CRESTOR) 20 MG tablet 90 tablet 3    Sig: Take 1 tablet (20 mg total) by mouth daily.    Return in about 4 months (around 02/26/2018).  Jonah Blue, MD, FACP

## 2017-11-02 ENCOUNTER — Ambulatory Visit: Payer: PPO | Admitting: Internal Medicine

## 2017-11-03 MED FILL — $VIAGRA 100 MG TABLET: 100 | 30 days supply | Qty: 10 | Fill #6

## 2017-11-17 DIAGNOSIS — M19032 Primary osteoarthritis, left wrist: Secondary | ICD-10-CM | POA: Diagnosis not present

## 2017-11-17 DIAGNOSIS — M79642 Pain in left hand: Secondary | ICD-10-CM | POA: Diagnosis not present

## 2017-12-07 MED FILL — $VIAGRA 100 MG TABLET: 100 | 30 days supply | Qty: 10 | Fill #7

## 2018-01-05 MED FILL — $VIAGRA 100 MG TABLET: 100 | 30 days supply | Qty: 10 | Fill #8

## 2018-01-18 MED FILL — metFORMIN HCL 500 MG TABS: 500 | 90 days supply | Qty: 180 | Fill #2

## 2018-01-18 MED FILL — ROSUVASTATIN CALCIUM 20 MG: 20 | 90 days supply | Qty: 90 | Fill #1

## 2018-01-18 MED FILL — LISINOPRIL 5 MG TAB: 5 | 90 days supply | Qty: 90 | Fill #1

## 2018-01-18 MED FILL — DOXAZOSIN MESYLATE 2 MG TAB: 2 | 90 days supply | Qty: 45 | Fill #2

## 2018-01-18 MED FILL — GABAPENTIN 300 MG CAPSULE: 300 | 90 days supply | Qty: 270 | Fill #2

## 2018-04-15 ENCOUNTER — Ambulatory Visit: Payer: Medicare Other | Admitting: Internal Medicine

## 2018-05-03 ENCOUNTER — Encounter: Payer: Self-pay | Admitting: Internal Medicine

## 2018-05-03 ENCOUNTER — Ambulatory Visit: Payer: Medicare Other | Attending: Internal Medicine | Admitting: Internal Medicine

## 2018-05-03 VITALS — BP 179/83 | HR 63 | Temp 97.7°F | Resp 16 | Ht 66.0 in | Wt 185.0 lb

## 2018-05-03 DIAGNOSIS — E119 Type 2 diabetes mellitus without complications: Secondary | ICD-10-CM | POA: Diagnosis not present

## 2018-05-03 DIAGNOSIS — R42 Dizziness and giddiness: Secondary | ICD-10-CM

## 2018-05-03 DIAGNOSIS — Z1159 Encounter for screening for other viral diseases: Secondary | ICD-10-CM

## 2018-05-03 DIAGNOSIS — R05 Cough: Secondary | ICD-10-CM

## 2018-05-03 DIAGNOSIS — E785 Hyperlipidemia, unspecified: Secondary | ICD-10-CM

## 2018-05-03 DIAGNOSIS — R053 Chronic cough: Secondary | ICD-10-CM

## 2018-05-03 DIAGNOSIS — I10 Essential (primary) hypertension: Secondary | ICD-10-CM | POA: Diagnosis not present

## 2018-05-03 LAB — POCT GLYCOSYLATED HEMOGLOBIN (HGB A1C): HBA1C, POC (CONTROLLED DIABETIC RANGE): 6.6 % (ref 0.0–7.0)

## 2018-05-03 LAB — GLUCOSE, POCT (MANUAL RESULT ENTRY): POC GLUCOSE: 107 mg/dL — AB (ref 70–99)

## 2018-05-03 MED ORDER — LORATADINE 10 MG PO TABS: 10.0000 mg | ORAL_TABLET | Freq: Every day | ORAL | 1 refills | Status: AC

## 2018-05-03 MED ORDER — METFORMIN HCL 500 MG PO TABS: 500.0000 mg | ORAL_TABLET | Freq: Two times a day (BID) | ORAL | 3 refills | Status: AC

## 2018-05-03 MED ORDER — AMLODIPINE BESYLATE 5 MG PO TABS: 5.0000 mg | ORAL_TABLET | Freq: Every day | ORAL | 3 refills | Status: AC

## 2018-05-03 MED ORDER — FLUTICASONE PROPIONATE 50 MCG/ACT NA SUSP: 1.0000 | Freq: Every day | NASAL | 1 refills | Status: AC

## 2018-05-03 MED ORDER — ROSUVASTATIN CALCIUM 10 MG PO TABS: 10.0000 mg | ORAL_TABLET | Freq: Every day | ORAL | 3 refills | Status: AC

## 2018-05-03 NOTE — Patient Instructions (Addendum)
Stop Delsym.  Start loratadine and Flonase nasal spray.  Go slow with position changes.  Your blood pressure is not well controlled.  Stop lisinopril.  We have started a new blood pressure medication called amlodipine.  Decrease Rosuvastatin to 10 mg daily.

## 2018-05-03 NOTE — Progress Notes (Signed)
Patient ID: Connor Murray, male    DOB: 09-30-1950  MRN: [id number]  CC: Diabetes and Hypertension   Subjective: Connor Murray is a 68 y.o. male who presents for chronic ds management. His concerns today include:  Patient with history of DM, HL, HTN, BPH, OA of the knee, ED, elev BP  C/o cough that is sometimes productive of yellow phlegm and other times dry x 1 mth.  Hears a vibration in his throat at nights just before the cough starts.  He denies any drainage at the back of the throat or nasal congestion.  He denies any reflux symptoms except occasionally when he eats spicy foods.  He has not had any shortness of breath or fever.  He has been taking Delsym over-the-counter.  On last visit with me we had started him on lisinopril 5 mg for blood pressure.  However patient states that he has been out of this medication for about 2 to 3 months.   Other new concern is dizziness when he stands up too quick or rolls in bed to cough.  Last a few minutes.  If it occurs when he stands up, he has to steady himself to hold onto the wall for a few minutes until it goes away then he can walk.  This episode started 1 month ago.  He has not had any falls.   HL:  C/o weakness in mornings and aching in muscles.  Stopped Crestor 1 mth ago because of this.  DM:  Needs RF on Metformin.  No device to check blood sugars but would like 1 if his insurance would pay for it.  He tells me that he stopped taking gabapentin a few months ago because he did not want to be addicted to any medication.  HTN:  No device to check blood pressure.  Limits salt in the foods.  He ran out of lisinopril 2 to 3 months ago.  He is not sure whether he is still taking Cardura or not.  He does not have his medicines with him today.  Patient Active Problem List   Diagnosis Date Noted  . Dizziness and giddiness 07/08/2016  . Type 2 diabetes mellitus without complication, without long-term current use of insulin (HCC) 08/01/2015  .  Left hand paresthesia 05/23/2015  . Viral wart 03/25/2015  . Prediabetes 03/21/2015  . Benign prostatic hyperplasia with urinary frequency 01/04/2015  . ERECTILE DYSFUNCTION 08/20/2014  . Left wrist pain 08/20/2014  . Dyslipidemia 03/02/2013  . Preventative health care 02/03/2013  . HYPERLIPIDEMIA 11/23/2006     Current Outpatient Medications on File Prior to Visit  Medication Sig Dispense Refill  . acetaminophen (TYLENOL) 325 MG tablet Take 2 tablets (650 mg total) by mouth every 8 (eight) hours as needed. 60 tablet 1  . doxazosin (CARDURA) 2 MG tablet Take 1 tablet (2 mg total) by mouth daily. 90 tablet 2  . pneumococcal 23 valent vaccine (PNEUMOVAX 23) 25 MCG/0.5ML injection To be administered by the pharmacist 0.5 mL 0  . sildenafil (VIAGRA) 100 MG tablet Take 1 tablet (100 mg total) by mouth daily as needed for erectile dysfunction. 30 tablet 3  . Skin Protectants, Misc. (EUCERIN) cream Apply topically as needed for dry skin. (Patient not taking: Reported on 05/03/2018) 454 g 0   No current facility-administered medications on file prior to visit.     No Known Allergies  Social History   Socioeconomic History  . Marital status: Divorced    Spouse name: Not on  file  . Number of children: Not on file  . Years of education: Not on file  . Highest education level: Not on file  Occupational History  . Occupation: Scientist, physiologicalcab driver  Social Needs  . Financial resource strain: Not on file  . Food insecurity:    Worry: Not on file    Inability: Not on file  . Transportation needs:    Medical: Not on file    Non-medical: Not on file  Tobacco Use  . Smoking status: Never Smoker  . Smokeless tobacco: Never Used  Substance and Sexual Activity  . Alcohol use: No  . Drug use: No  . Sexual activity: Yes  Lifestyle  . Physical activity:    Days per week: Not on file    Minutes per session: Not on file  . Stress: Not on file  Relationships  . Social connections:    Talks on phone:  Not on file    Gets together: Not on file    Attends religious service: Not on file    Active member of club or organization: Not on file    Attends meetings of clubs or organizations: Not on file    Relationship status: Not on file  . Intimate partner violence:    Fear of current or ex partner: Not on file    Emotionally abused: Not on file    Physically abused: Not on file    Forced sexual activity: Not on file  Other Topics Concern  . Not on file  Social History Narrative  . Not on file    No family history on file.  No past surgical history on file.  ROS: Review of Systems Neg except as above  PHYSICAL EXAM: BP (!) 179/83   Pulse 63   Temp 97.7 F (36.5 C) (Oral)   Resp 16   Ht 5\' 6"  (1.676 m)   Wt 185 lb (83.9 kg)   SpO2 96%   BMI 29.86 kg/m   Wt Readings from Last 3 Encounters:  05/03/18 185 lb (83.9 kg)  10/26/17 180 lb 9.6 oz (81.9 kg)  08/03/17 180 lb (81.6 kg)   BP sitting 177/92, P 63; standing BP 164/81, P 61 Physical Exam General appearance - alert, well appearing, older male and in no distress Mental status - normal mood, behavior, speech, dress, motor activity, and thought processes Mouth - mucous membranes moist, pharynx normal without lesions  Nose: Nasal mucosa is dry.  The turbinates appear normal Neck - supple, no significant adenopathy Chest - clear to auscultation, no wheezes, rales or rhonchi, symmetric air entry Heart - normal rate, regular rhythm, normal S1, S2, no murmurs, rubs, clicks or gallops Extremities -no lower extremity edema Diabetic Foot Exam - Simple   Simple Foot Form Visual Inspection See comments:  Yes Sensation Testing See comments:  Yes Pulse Check Posterior Tibialis and Dorsalis pulse intact bilaterally:  Yes Comments Soles of the feet are very dry with some cracking of the skin on the heels.  Decreased sensation to leap exam on the heels      Results for orders placed or performed in visit on 05/03/18  POCT  glucose (manual entry)  Result Value Ref Range   POC Glucose 107 (A) 70 - 99 mg/dl  POCT glycosylated hemoglobin (Hb A1C)  Result Value Ref Range   Hemoglobin A1C     HbA1c POC (<> result, manual entry)     HbA1c, POC (prediabetic range)     HbA1c, POC (controlled  diabetic range) 6.6 0.0 - 7.0 %   Depression screen [facility] 2/9 05/03/2018 08/03/2017 06/17/2016  Decreased Interest 0 3 0  Down, Depressed, Hopeless 0 3 0  PHQ - 2 Score 0 6 0  Altered sleeping - 1 -  Tired, decreased energy - 2 -  Change in appetite - 2 -  Feeling bad or failure about yourself  - 2 -  Trouble concentrating - 3 -  Moving slowly or fidgety/restless - 0 -  Suicidal thoughts - 0 -  PHQ-9 Score - 16 -    ASSESSMENT AND PLAN: 1. Type 2 diabetes mellitus without complication, without long-term current use of insulin (HCC) At goal.  Continue metformin.  Encourage healthy eating habits and regular exercise - POCT glucose (manual entry) - POCT glycosylated hemoglobin (Hb A1C) - metFORMIN (GLUCOPHAGE) 500 MG tablet; Take 1 tablet (500 mg total) by mouth 2 (two) times daily with a meal.  Dispense: 180 tablet; Refill: 3 - CBC - Comprehensive metabolic panel  2. Persistent cough Likely upper airway syndrome.  Will give a trial of Claritin and Flonase nasal spray.  Discontinue Delsym.  3. Dizziness on standing Advised to go slow with position changes.  Encourage hydration.  4. Dyslipidemia Given complaint of muscle aches, I recommend decreasing Crestor to 10 mg daily to see if he tolerates a lower dose.  Patient is agreeable to this - rosuvastatin (CRESTOR) 10 MG tablet; Take 1 tablet (10 mg total) by mouth daily.  Dispense: 30 tablet; Refill: 3  5. Essential hypertension Not at goal.  Advised to stop over-the-counter cold and flu remedies.  Lisinopril taken off med list.  I chose not to refill that at this time so as not to complicate evaluation and treatment of cough.  Start amlodipine.  We will have him return in  4 weeks for recheck on blood pressure and dizziness Handwritten prescription given for him to obtain an automated blood pressure monitoring device for him to check blood pressure twice a week. - amLODipine (NORVASC) 5 MG tablet; Take 1 tablet (5 mg total) by mouth daily.  Dispense: 90 tablet; Refill: 3  6. Need for hepatitis C screening test - Hepatitis c antibody (reflex)    Patient was given the opportunity to ask questions.  Patient verbalized understanding of the plan and was able to repeat key elements of the plan.   Orders Placed This Encounter  Procedures  . Hepatitis c antibody (reflex)  . CBC  . Comprehensive metabolic panel  . POCT glucose (manual entry)  . POCT glycosylated hemoglobin (Hb A1C)     Requested Prescriptions   Signed Prescriptions Disp Refills  . loratadine (CLARITIN) 10 MG tablet 30 tablet 1    Sig: Take 1 tablet (10 mg total) by mouth daily.  . fluticasone (FLONASE) 50 MCG/ACT nasal spray 16 g 1    Sig: Place 1 spray into both nostrils daily.  . rosuvastatin (CRESTOR) 10 MG tablet 30 tablet 3    Sig: Take 1 tablet (10 mg total) by mouth daily.  Marland Kitchen amLODipine (NORVASC) 5 MG tablet 90 tablet 3    Sig: Take 1 tablet (5 mg total) by mouth daily.  . metFORMIN (GLUCOPHAGE) 500 MG tablet 180 tablet 3    Sig: Take 1 tablet (500 mg total) by mouth 2 (two) times daily with a meal.    Return in about 1 month (around 06/03/2018).  Jonah Blue, MD, FACP

## 2018-05-04 LAB — COMPREHENSIVE METABOLIC PANEL
ALT: 20 IU/L (ref 0–44)
AST: 21 IU/L (ref 0–40)
Albumin/Globulin Ratio: 1.2 (ref 1.2–2.2)
Albumin: 4.4 g/dL (ref 3.8–4.8)
Alkaline Phosphatase: 72 IU/L (ref 39–117)
BUN/Creatinine Ratio: 13 (ref 10–24)
BUN: 12 mg/dL (ref 8–27)
Bilirubin Total: 0.3 mg/dL (ref 0.0–1.2)
CALCIUM: 10.1 mg/dL (ref 8.6–10.2)
CO2: 25 mmol/L (ref 20–29)
Chloride: 99 mmol/L (ref 96–106)
Creatinine, Ser: 0.95 mg/dL (ref 0.76–1.27)
GFR calc Af Amer: 95 mL/min/{1.73_m2} (ref >59)
GFR calc non Af Amer: 82 mL/min/{1.73_m2} (ref >59)
Globulin, Total: 3.6 g/dL (ref 1.5–4.5)
Glucose: 106 mg/dL — ABNORMAL HIGH (ref 65–99)
Potassium: 4.8 mmol/L (ref 3.5–5.2)
Sodium: 140 mmol/L (ref 134–144)
Total Protein: 8 g/dL (ref 6.0–8.5)

## 2018-05-04 LAB — CBC
HEMATOCRIT: 42.5 % (ref 37.5–51.0)
HEMOGLOBIN: 14.6 g/dL (ref 13.0–17.7)
MCH: 31.1 pg (ref 26.6–33.0)
MCHC: 34.4 g/dL (ref 31.5–35.7)
MCV: 90 fL (ref 79–97)
Platelets: 326 10*3/uL (ref 150–450)
RBC: 4.7 x10E6/uL (ref 4.14–5.80)
RDW: 12.4 % (ref 11.6–15.4)
WBC: 6.7 10*3/uL (ref 3.4–10.8)

## 2018-05-04 LAB — HCV COMMENT:

## 2018-05-04 LAB — HEPATITIS C ANTIBODY (REFLEX)

## 2018-05-05 ENCOUNTER — Telehealth: Payer: Self-pay | Admitting: Internal Medicine

## 2018-05-05 NOTE — Telephone Encounter (Signed)
Patient called to check on the status of their results. Please follow up/ °

## 2018-05-10 NOTE — Telephone Encounter (Signed)
Returned pt call an left a detailed vm informing pt of results and if he has any questions or concerns to give me a call  This my second attempt to contact pt

## 2018-07-28 ENCOUNTER — Ambulatory Visit: Payer: Medicare Other

## 2019-01-02 ENCOUNTER — Ambulatory Visit: Payer: Medicare Other

## 2019-01-09 ENCOUNTER — Other Ambulatory Visit: Payer: Self-pay | Admitting: Internal Medicine

## 2019-01-09 DIAGNOSIS — Z136 Encounter for screening for cardiovascular disorders: Secondary | ICD-10-CM

## 2019-01-30 ENCOUNTER — Ambulatory Visit
Admission: RE | Admit: 2019-01-30 | Discharge: 2019-01-30 | Disposition: A | Discharge status: Home / Self Care | Payer: Medicare Other | Source: Ambulatory Visit | Attending: Internal Medicine | Admitting: Internal Medicine

## 2019-01-30 DIAGNOSIS — Z136 Encounter for screening for cardiovascular disorders: Secondary | ICD-10-CM

## 2019-02-22 ENCOUNTER — Ambulatory Visit
Admission: RE | Admit: 2019-02-22 | Discharge: 2019-02-22 | Disposition: A | Discharge status: Home / Self Care | Payer: Medicare Other | Source: Ambulatory Visit | Attending: Internal Medicine | Admitting: Internal Medicine

## 2019-02-22 ENCOUNTER — Other Ambulatory Visit: Payer: Self-pay

## 2019-02-22 ENCOUNTER — Other Ambulatory Visit: Payer: Self-pay | Admitting: Internal Medicine

## 2019-02-22 DIAGNOSIS — M25552 Pain in left hip: Secondary | ICD-10-CM

## 2019-02-22 DIAGNOSIS — M25551 Pain in right hip: Secondary | ICD-10-CM

## 2019-04-17 ENCOUNTER — Other Ambulatory Visit: Payer: Self-pay | Admitting: Internal Medicine

## 2019-04-17 DIAGNOSIS — E119 Type 2 diabetes mellitus without complications: Secondary | ICD-10-CM

## 2019-04-21 ENCOUNTER — Other Ambulatory Visit: Payer: Self-pay | Admitting: Internal Medicine

## 2019-04-21 DIAGNOSIS — E119 Type 2 diabetes mellitus without complications: Secondary | ICD-10-CM

## 2020-03-20 ENCOUNTER — Ambulatory Visit
Admission: RE | Admit: 2020-03-20 | Discharge: 2020-03-20 | Disposition: A | Discharge status: Home / Self Care | Payer: Medicare HMO | Source: Ambulatory Visit | Attending: Internal Medicine | Admitting: Internal Medicine

## 2020-03-20 ENCOUNTER — Other Ambulatory Visit: Payer: Self-pay | Admitting: Internal Medicine

## 2020-03-20 ENCOUNTER — Other Ambulatory Visit: Payer: Self-pay

## 2020-04-10 ENCOUNTER — Other Ambulatory Visit: Payer: Self-pay

## 2020-04-10 ENCOUNTER — Ambulatory Visit: Payer: Medicare HMO | Attending: Internal Medicine

## 2020-04-10 DIAGNOSIS — M545 Low back pain, unspecified: Secondary | ICD-10-CM | POA: Diagnosis present

## 2020-04-10 DIAGNOSIS — M62830 Muscle spasm of back: Secondary | ICD-10-CM | POA: Diagnosis present
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
## 2020-05-01 ENCOUNTER — Ambulatory Visit: Payer: Medicare HMO

## 2020-05-02 ENCOUNTER — Ambulatory Visit: Payer: Medicare HMO | Attending: Internal Medicine | Admitting: Physical Therapy

## 2020-05-02 ENCOUNTER — Other Ambulatory Visit: Payer: Self-pay

## 2020-05-02 ENCOUNTER — Ambulatory Visit: Payer: Medicare HMO

## 2020-05-02 ENCOUNTER — Encounter: Payer: Self-pay | Admitting: Physical Therapy

## 2020-05-02 DIAGNOSIS — M62830 Muscle spasm of back: Secondary | ICD-10-CM | POA: Insufficient documentation

## 2020-05-02 DIAGNOSIS — M545 Low back pain, unspecified: Secondary | ICD-10-CM | POA: Diagnosis not present

## 2020-05-02 NOTE — Patient Instructions (Signed)
Access Code: [email or web address] URL: [email or web address] Date: 05/02/2020 Prepared by: Rosana Hoes  Exercises Hooklying Single Knee to Chest Stretch - 3 x daily - 7 x weekly - 3 reps - 20 hold Supine Piriformis Stretch with Foot on Ground - 3 x daily - 7 x weekly - 3 reps - 20 hold Supine Lower Trunk Rotation - 3 x daily - 7 x weekly - 1 sets - 10 reps - 5 hold Supine Bridge - 3 x daily - 7 x weekly - 1 sets - 10 reps - 3 hold Supine Active Straight Leg Raise - 3 x daily - 7 x weekly - 1 sets - 10 reps - 3 hold Clamshell - 3 x daily - 7 x weekly - 10 reps - 3 hold Squat with Counter Support - 3 x daily - 7 x weekly - 1 sets - 10 reps - 3 hold Banded Row - 3 x daily - 7 x weekly - 1 sets - 10 reps - 3 hold

## 2020-05-02 NOTE — Therapy (Signed)
[facility] [address] [place], [place], [address] Phone: [phone]   Fax:  [phone]  Physical Therapy Treatment  Patient Details  Name: Connor Murray MRN: [id number] Date of Birth: [birth year] Referring Provider (PT): [place], MD   Encounter Date: 05/02/2020   PT End of Session - 05/02/20 1705    Visit Number 2    Number of Visits 17    Date for PT Re-Evaluation 06/15/20    Authorization Type [employer] MEDICARE HMO/PPO    Progress Note Due on Visit 10    PT Start Time 1700    PT Stop Time 1738    PT Time Calculation (min) 38 min    Activity Tolerance Patient tolerated treatment well    Behavior During Therapy [facility] for tasks assessed/performed           Past Medical History:  Diagnosis Date  . Diabetes mellitus without complication (HCC)     History reviewed. No pertinent surgical history.  There were no vitals filed for this visit.   Subjective Assessment - 05/02/20 1703    Subjective Patient reports in the morning it is difficulty for him to get up and walk as usual until he does some exercises. Patient also reports wrist and knee pain. He continues to walk around and climb stairs at work even if his knees hurt.    Patient Stated Goals For the pain to go away.    Currently in Pain? Yes    Pain Score 7     Pain Location Back    Pain Orientation Lower    Pain Descriptors / Indicators Aching;Tightness;Sharp    Pain Type Acute pain    Pain Onset More than a month ago    Pain Frequency Constant                             [facility] Adult PT Treatment/Exercise - 05/02/20 0001      Exercises   Exercises Lumbar      Lumbar Exercises: Stretches   Single Knee to Chest Stretch 2 reps;20 seconds    Lower Trunk Rotation 5 reps;10 seconds    Piriformis Stretch 2 reps;20 seconds      Lumbar Exercises: Standing   Functional Squats 10 reps;3 seconds    Functional Squats Limitations holding  on to sink sitting hips back to get stretch in lower back    Row 10 reps   3 sec hold   Theraband Level (Row) Level 3 (Green)      Lumbar Exercises: Supine   Bridge 10 reps;3 seconds    Straight Leg Raise 10 reps      Lumbar Exercises: Sidelying   Clam 10 reps;3 seconds                  PT Education - 05/02/20 1705    Education Details HEP update    Person(s) Educated Patient    Methods Explanation;Demonstration;Verbal cues;Handout    Comprehension Verbalized understanding;Need further instruction;Returned demonstration;Verbal cues required            PT Short Term Goals - 05/02/20 1726      PT SHORT TERM GOAL #1   Title Pt will be ind in an initial HEP    Baseline Started on eval    Status Achieved    Target Date 05/02/20      PT SHORT TERM GOAL #2   Title Pt will voice understanding  of measures to assist in the reduction of low back pain.    Status On-going    Target Date 05/02/20             PT Long Term Goals - 04/11/20 2344      PT LONG TERM GOAL #1   Title Pt will be Ind in a final HEP to maintain or progress achieved LOF    Status New    Target Date 06/15/20      PT LONG TERM GOAL #2   Title Pt will report a decrease in the ow back pain to 3/10 or less with daily activities    Baseline 8/10    Status New    Target Date 06/15/20      PT LONG TERM GOAL #3   Title Pt will return demonstration of proper body mechanices with activities of daily living    Status New    Target Date 06/13/20      PT LONG TERM GOAL #4   Title Pt's FOTO score will improve to predicted level of 76%    Baseline 44%    Status New    Target Date 06/15/20                 Plan - 05/02/20 1706    Clinical Impression Statement Patient tolerated therapy well with no adverse effects. Therapy continued focus on improving hip/lumbar mobility and progressing strength with addtion of hip, knee, and postural strengthening exercises to addess back, shoulder, and knee,  pain. Patient tolerated therapy well with no report of increased pain with exercise. Patient would benefit from continued skilled PT to progress mobility and strength to reduce pain and maximize function.    PT Treatment/Interventions ADLs/Self Care Home Management;Cryotherapy;Electrical Stimulation;Ultrasound;Traction;Moist Heat;Iontophoresis 4mg /ml Dexamethasone;Therapeutic activities;Therapeutic exercise;Manual techniques;Patient/family education;Dry needling;Taping    PT Next Visit Plan Assess response to HEP. Progress ther ex, use modalities, and manual care as indicated. Review FOTO with pt.    PT Home Exercise Plan [id number] and to walk 5 -10 mins every 1 to 2 hours to encourage movement    Consulted and Agree with Plan of Care Patient           Patient will benefit from skilled therapeutic intervention in order to improve the following deficits and impairments:  Difficulty walking,Increased muscle spasms,Pain,Decreased activity tolerance  Visit Diagnosis: Acute low back pain without sciatica, unspecified back pain laterality  Muscle spasm of back     Problem List Patient Active Problem List   Diagnosis Date Noted  . Persistent cough 05/03/2018  . Essential hypertension 05/03/2018  . Dizziness and giddiness 07/08/2016  . Type 2 diabetes mellitus without complication, without long-term current use of insulin (HCC) 08/01/2015  . Left hand paresthesia 05/23/2015  . Viral wart 03/25/2015  . Benign prostatic hyperplasia with urinary frequency 01/04/2015  . ERECTILE DYSFUNCTION 08/20/2014  . Left wrist pain 08/20/2014  . Dyslipidemia 03/02/2013  . HYPERLIPIDEMIA 11/23/2006    01/23/2007, PT, DPT, LAT, ATC 05/02/20  5:40 PM Phone: [phone] Fax: [phone]   [facility] [address] [place], [place], [place] Phone: [phone]   Fax:  [phone]  Name: Connor Murray MRN: Ree Kida Date of Birth:  Apr 20, 1950

## 2020-05-03 ENCOUNTER — Ambulatory Visit: Payer: Medicare HMO

## 2020-05-08 ENCOUNTER — Ambulatory Visit: Payer: Medicare HMO

## 2020-05-10 ENCOUNTER — Ambulatory Visit: Payer: Medicare HMO

## 2020-05-10 ENCOUNTER — Other Ambulatory Visit: Payer: Self-pay

## 2020-05-10 DIAGNOSIS — M62830 Muscle spasm of back: Secondary | ICD-10-CM

## 2020-05-10 DIAGNOSIS — M545 Low back pain, unspecified: Secondary | ICD-10-CM

## 2020-05-10 NOTE — Therapy (Addendum)
[place], [place], [address] Phone: [phone]   Fax:  [phone]  Physical Therapy Treatment/Discharge  Patient Details  Name: Connor Murray MRN: [id number] Date of Birth: 04/08/1951 Referring Provider (PT): Audley Hose, MD   Encounter Date: 05/10/2020   PT End of Session - 05/10/20 0917    Visit Number 3    Number of Visits 17    Date for PT Re-Evaluation 06/15/20    Authorization Type [employer] MEDICARE HMO/PPO    Progress Note Due on Visit 10    PT Start Time 0916    PT Stop Time 1004    PT Time Calculation (min) 48 min    Activity Tolerance Patient tolerated treatment well    Behavior During Therapy [facility] for tasks assessed/performed           Past Medical History:  Diagnosis Date  . Diabetes mellitus without complication ([place])     No past surgical history on file.  There were no vitals filed for this visit.   Subjective Assessment - 05/10/20 0922    Subjective Pt reports he is feeling better. 5/10 lateral bacl pelvis pain when he woke up, but is currently not havig pain in this area. Pt reports reposts he is having wrist and ankle pain.    Diagnostic tests 03/21/20:FINDINGS:  Five lumbar type vertebral segments. Vertebral body heights and  alignment are maintained. No fracture identified. Multilevel  intervertebral disc space loss with associated degenerative endplate  changes, most pronounced at L5-S1. Lower lumbar facet arthrosis.  Scattered abdominal aortic atherosclerotic calcification.     IMPRESSION:  1. No acute fracture or static listhesis of the lumbar spine.  2. Moderate multilevel lumbar spondylosis.    Patient Stated Goals For the pain to go away.    Currently in Pain? Yes    Pain Score 0-No pain    Pain Location Back   and pelvis   Pain Orientation Lateral;Upper    Pain Descriptors / Indicators Dull    Pain Type Chronic pain    Pain Onset More than a month ago    Pain  Frequency Constant                             [facility] Adult PT Treatment/Exercise - 05/10/20 0001      Lumbar Exercises: Stretches   Active Hamstring Stretch 1 rep;20 seconds    Active Hamstring Stretch Limitations supine; hands behind knees    Single Knee to Chest Stretch 1 rep;20 seconds    Lower Trunk Rotation 5 reps;10 seconds    Piriformis Stretch 1 rep;20 seconds      Lumbar Exercises: Aerobic   Nustep 7 mins; L4; UEs/LEs      Lumbar Exercises: Standing   Functional Squats 10 reps;3 seconds    Functional Squats Limitations holding on to sink sitting hips back to get stretch in lower back    Row 10 reps   3 sec hold   Theraband Level (Row) Level 3 (Green)      Lumbar Exercises: Supine   Pelvic Tilt 10 reps    Bridge 10 reps;3 seconds    Straight Leg Raise 10 reps;3 seconds    Other Supine Lumbar Exercises Hip abd/clams c green Tband; 15x                  PT Education - 05/10/20 2153    Education Details HEP  Person(s) Educated Patient    Methods Explanation;Demonstration;Tactile cues;Verbal cues;Handout    Comprehension Verbalized understanding;Returned demonstration;Verbal cues required;Tactile cues required;Need further instruction            PT Short Term Goals - 05/02/20 1726      PT SHORT TERM GOAL #1   Title Pt will be ind in an initial HEP    Baseline Started on eval    Status Achieved    Target Date 05/02/20      PT SHORT TERM GOAL #2   Title Pt will voice understanding of measures to assist in the reduction of low back pain.    Status On-going    Target Date 05/02/20             PT Long Term Goals - 04/11/20 2344      PT LONG TERM GOAL #1   Title Pt will be Ind in a final HEP to maintain or progress achieved LOF    Status New    Target Date 06/15/20      PT LONG TERM GOAL #2   Title Pt will report a decrease in the ow back pain to 3/10 or less with daily activities    Baseline 8/10    Status New    Target  Date 06/15/20      PT LONG TERM GOAL #3   Title Pt will return demonstration of proper body mechanices with activities of daily living    Status New    Target Date 06/13/20      PT LONG TERM GOAL #4   Title Pt's FOTO score will improve to predicted level of 76%    Baseline 44%    Status New    Target Date 06/15/20                 Plan - 05/10/20 0949    Clinical Impression Statement Recommended pt contacting his MD to request a referral for his wrist and ankles if he wanted PT care for these areas. Pt reports compliance with his HEP. Pt is responding positively to lumbopelvic mobility and strengthening/stability ther ex as demonstrated with the completion of ther ex and pt's subjective report. Core strengthening was initiated and added to the HEP. Pt will continue to benefit from PT to optimize his functional abilities.    Personal Factors and Comorbidities Comorbidity 1    Comorbidities DM    Examination-Activity Limitations Locomotion Level;Stand    Stability/Clinical Decision Making Stable/Uncomplicated    Clinical Decision Making Low    Rehab Potential Good    PT Frequency 2x / week    PT Duration 8 weeks    PT Treatment/Interventions ADLs/Self Care Home Management;Cryotherapy;Electrical Stimulation;Ultrasound;Traction;Moist Heat;Iontophoresis 2m/ml Dexamethasone;Therapeutic activities;Therapeutic exercise;Manual techniques;Patient/family education;Dry needling;Taping    PT Next Visit Plan Assess response to HEP. Progress ther ex, use modalities, and manual care as indicated. Review FOTO with pt.    PT Home Exercise Plan [id number] and to walk 5 -10 mins every 1 to 2 hours to encourage movement    Consulted and Agree with Plan of Care Patient           Patient will benefit from skilled therapeutic intervention in order to improve the following deficits and impairments:  Difficulty walking,Increased muscle spasms,Pain,Decreased activity tolerance  Visit Diagnosis: Acute  low back pain without sciatica, unspecified back pain laterality  Muscle spasm of back     Problem List Patient Active Problem List   Diagnosis Date Noted  . Persistent cough  05/03/2018  . Essential hypertension 05/03/2018  . Dizziness and giddiness 07/08/2016  . Type 2 diabetes mellitus without complication, without long-term current use of insulin ([place]) 08/01/2015  . Left hand paresthesia 05/23/2015  . Viral wart 03/25/2015  . Benign prostatic hyperplasia with urinary frequency 01/04/2015  . ERECTILE DYSFUNCTION 08/20/2014  . Left wrist pain 08/20/2014  . Dyslipidemia 03/02/2013  . HYPERLIPIDEMIA 11/23/2006    Gar Ponto MS, PT 05/10/20 10:14 PM  [facility] [address] [place], [place], [address] Phone: [phone]   Fax:  [phone]  Name: Jermy Couper MRN: [id number] Date of Birth: September 23, 1950   PHYSICAL THERAPY DISCHARGE SUMMARY  Visits from Start of Care: 3  Current functional level related to goals / functional outcomes: See above   Remaining deficits: Unknown, pt stopped attending PT  Education / Equipment: HEP  Plan: Patient agrees to discharge.  Patient goals were partially met. Patient is being discharged due to not returning since the last visit.  ?????        Tresia Revolorio MS, PT 07/09/20 12:11 PM

## 2020-05-15 ENCOUNTER — Ambulatory Visit: Payer: Medicare HMO

## 2020-05-17 ENCOUNTER — Ambulatory Visit: Payer: Medicare HMO

## 2020-10-04 ENCOUNTER — Other Ambulatory Visit: Payer: Self-pay | Admitting: Internal Medicine

## 2020-10-04 DIAGNOSIS — R2689 Other abnormalities of gait and mobility: Secondary | ICD-10-CM

## 2020-10-31 ENCOUNTER — Ambulatory Visit
Admission: RE | Admit: 2020-10-31 | Discharge: 2020-10-31 | Disposition: A | Discharge status: Home / Self Care | Payer: Medicare HMO | Source: Ambulatory Visit | Attending: Internal Medicine | Admitting: Internal Medicine

## 2020-10-31 ENCOUNTER — Other Ambulatory Visit: Payer: Self-pay

## 2020-10-31 DIAGNOSIS — R2689 Other abnormalities of gait and mobility: Secondary | ICD-10-CM

## 2020-11-02 ENCOUNTER — Inpatient Hospital Stay: Admission: RE | Admit: 2020-11-02 | Payer: Medicare HMO | Source: Ambulatory Visit

## 2021-02-07 ENCOUNTER — Other Ambulatory Visit: Payer: Self-pay | Admitting: Internal Medicine

## 2021-02-07 DIAGNOSIS — I739 Peripheral vascular disease, unspecified: Secondary | ICD-10-CM

## 2021-02-12 ENCOUNTER — Other Ambulatory Visit: Payer: Self-pay

## 2021-02-12 ENCOUNTER — Other Ambulatory Visit: Payer: Self-pay | Admitting: Internal Medicine

## 2021-02-12 ENCOUNTER — Ambulatory Visit
Admission: RE | Admit: 2021-02-12 | Discharge: 2021-02-12 | Disposition: A | Discharge status: Home / Self Care | Payer: Medicare HMO | Source: Ambulatory Visit | Attending: Internal Medicine | Admitting: Internal Medicine

## 2021-02-12 DIAGNOSIS — I739 Peripheral vascular disease, unspecified: Secondary | ICD-10-CM

## 2021-04-02 ENCOUNTER — Encounter: Payer: Self-pay | Admitting: Rehabilitative and Restorative Service Providers"

## 2021-04-02 ENCOUNTER — Ambulatory Visit: Payer: Medicare HMO | Attending: Internal Medicine | Admitting: Rehabilitative and Restorative Service Providers"

## 2021-04-02 ENCOUNTER — Other Ambulatory Visit: Payer: Self-pay

## 2021-04-02 DIAGNOSIS — R2689 Other abnormalities of gait and mobility: Secondary | ICD-10-CM | POA: Insufficient documentation

## 2021-04-02 DIAGNOSIS — H8112 Benign paroxysmal vertigo, left ear: Secondary | ICD-10-CM | POA: Insufficient documentation

## 2021-04-02 NOTE — Therapy (Signed)
[facility] [address] [place], [place], [address] Phone: [phone]   Fax:  [phone]  Physical Therapy Evaluation  Patient Details  Name: Connor Murray MRN: [id number] Date of Birth: 06/14/1950 Referring Provider (PT): Jamison Oka  MD   Encounter Date: 04/02/2021   PT End of Session - 04/02/21 1052     Visit Number 1    Number of Visits 4    Date for PT Re-Evaluation 05/02/21    PT Start Time 1016    PT Stop Time 1052    PT Time Calculation (min) 36 min             Past Medical History:  Diagnosis Date   Diabetes mellitus without complication (HCC)     History reviewed. No pertinent surgical history.  There were no vitals filed for this visit.    Subjective Assessment - 04/02/21 1020     Subjective The patient has had vertigo in the past approximately 2-3 years ago that lasted 4 weeks.  He notes about a month ago, vertigo returned and is worse with L head motion.  He reports symptoms last 1-2 hours.    Patient Stated Goals to get rid of it    Currently in Pain? No/denies                [facility] PT Assessment - 04/02/21 1024       Assessment   Medical Diagnosis vertigo    Referring Provider (PT) Jamison Oka  MD    Onset Date/Surgical Date 03/13/21    Hand Dominance Right    Prior Therapy none      Precautions   Precautions None      Restrictions   Weight Bearing Restrictions No      Balance Screen   Has the patient fallen in the past 6 months Yes    How many times? staggers and grabs furniture/walls to avoid hitting the ground    Has the patient had a decrease in activity level because of a fear of falling?  No    Is the patient reluctant to leave their home because of a fear of falling?  No      Home [employer] information [employer] manager residence    Living Arrangements --   lives with family     Prior Function   Level of Independence Independent      Observation/Other  Assessments   Focus on Therapeutic Outcomes (FOTO)  n/a- not set up at clinic                    Vestibular Assessment - 04/02/21 1026       Symptom Behavior   Subjective history of current problem Onset approximately 1 month ago    Type of Dizziness  Spinning;Imbalance    Frequency of Dizziness daily    Duration of Dizziness minutes to hour    Symptom Nature Motion provoked;Positional    Aggravating Factors Activity in general;Turning head quickly;Lying supine    Relieving Factors Head stationary    Progression of Symptoms Worse    History of similar episodes one prior episode 2-3 years ago      Oculomotor Exam   Oculomotor Alignment Normal    Ocular ROM WFLs    Spontaneous Absent    Gaze-induced  Absent    Smooth Pursuits Intact    Saccades Intact;Slow      Vestibulo-Ocular Reflex   VOR 1 Head  Only (x 1 viewing) No dizziness with self paced VOR    Comment head impulse test= WNLs bilaterally      Positional Testing   Dix-Hallpike Dix-Hallpike Right;Dix-Hallpike Left    Horizontal Canal Testing Horizontal Canal Right;Horizontal Canal Left      Dix-Hallpike Right   Dix-Hallpike Right Duration none    Dix-Hallpike Right Symptoms No nystagmus      Dix-Hallpike Left   Dix-Hallpike Left Duration none    Dix-Hallpike Left Symptoms No nystagmus      Horizontal Canal Right   Horizontal Canal Right Symptoms Geotrophic      Horizontal Canal Left   Horizontal Canal Left Symptoms Geotrophic                Objective measurements completed on examination: See above findings.        Vestibular Treatment/Exercise - 04/02/21 1048       Vestibular Treatment/Exercise   Vestibular Treatment Provided Canalith Repositioning    Canalith Repositioning Canal Roll Left      Canal Roll Left   Number of Reps  2    Overall Response  Improved Symptoms    Response Details  --   after 2 reps, did not have positive L horizontal roll testing                    PT Education - 04/02/21 1301     Education Details education re: BPPV    Person(s) Educated Patient    Methods Explanation    Comprehension Verbalized understanding                 PT Long Term Goals - 04/02/21 1302       PT LONG TERM GOAL #1   Title The patient will have negative positional testing for BPPV.    Time 4    Period Weeks    Target Date 05/02/21      PT LONG TERM GOAL #2   Title The patient will report no dizziness with daily activities.    Time 4    Period Weeks    Target Date 05/02/21      PT LONG TERM GOAL #3   Title The patient will verbalize understanding of self treatment for BPPV.    Time 4    Period Weeks    Target Date 05/02/21                    Plan - 04/02/21 1303     Clinical Impression Statement The patient is a 70 year old male preenting to OP physical therapy with c/o positional vertigo when moving to the left.  During today's evaluation, he presents with positive L horiozntal roll teest for geotropic nystagmus indicating L horizontal canalithiasis.  He tolerated canolith repositioning well.  Plan to reassess and address deficiits as needed.    Personal Factors and Comorbidities Comorbidity 2    Comorbidities HTN, diabetes    Examination-Activity Limitations Bed Mobility    Stability/Clinical Decision Making Stable/Uncomplicated    Clinical Decision Making Low    Rehab Potential Good    PT Frequency 1x / week    PT Duration 4 weeks    PT Treatment/Interventions ADLs/Self Care Home Management;Canalith Repostioning;Vestibular;Patient/family education;Neuromuscular re-education;Therapeutic exercise    PT Next Visit Plan Reassess positional testing and treat as indicated.    Consulted and Agree with Plan of Care Patient             Patient will  benefit from skilled therapeutic intervention in order to improve the following deficits and impairments:  Dizziness, Decreased activity tolerance, Decreased  balance  Visit Diagnosis: BPPV (benign paroxysmal positional vertigo), left  Other abnormalities of gait and mobility     Problem List Patient Active Problem List   Diagnosis Date Noted   Persistent cough 05/03/2018   Essential hypertension 05/03/2018   Dizziness and giddiness 07/08/2016   Type 2 diabetes mellitus without complication, without long-term current use of insulin (HCC) 08/01/2015   Left hand paresthesia 05/23/2015   Viral wart 03/25/2015   Benign prostatic hyperplasia with urinary frequency 01/04/2015   ERECTILE DYSFUNCTION 08/20/2014   Left wrist pain 08/20/2014   Dyslipidemia 03/02/2013   HYPERLIPIDEMIA 11/23/2006    Vannie Hilgert, PT 04/02/2021, 1:11 PM  [facility] [address] [place], [place], [address] Phone: [phone]   Fax:  [phone]  Name: Connor Murray MRN: [id number] Date of Birth: Sep 10, 1950

## 2021-04-09 ENCOUNTER — Other Ambulatory Visit: Payer: Self-pay

## 2021-04-09 ENCOUNTER — Encounter: Payer: Self-pay | Admitting: Rehabilitative and Restorative Service Providers"

## 2021-04-09 ENCOUNTER — Ambulatory Visit: Payer: Medicare HMO | Admitting: Rehabilitative and Restorative Service Providers"

## 2021-04-09 DIAGNOSIS — R2689 Other abnormalities of gait and mobility: Secondary | ICD-10-CM

## 2021-04-09 DIAGNOSIS — H8112 Benign paroxysmal vertigo, left ear: Secondary | ICD-10-CM | POA: Diagnosis not present

## 2021-04-09 NOTE — Therapy (Addendum)
[facility] [address], [place], [place], [address] Phone: [phone]   Fax:  [phone]  Physical Therapy Treatment/ Discharge Summary  Patient Details  Name: Connor Murray MRN: [id number] Date of Birth: 08-26-1950 Referring Provider (PT): Latanya Presser  MD   Encounter Date: 04/09/2021   PT End of Session - 04/09/21 1018     Visit Number 2    Number of Visits 4    Date for PT Re-Evaluation 05/02/21    PT Start Time 1016    PT Stop Time 1102    PT Time Calculation (min) 46 min             Past Medical History:  Diagnosis Date   Diabetes mellitus without complication ([place])     History reviewed. No pertinent surgical history.  There were no vitals filed for this visit.   Subjective Assessment - 04/09/21 1015     Subjective The patient reports no change in vertigo since last session.    Patient Stated Goals to get rid of it    Currently in Pain? No/denies                [facility] PT Assessment - 04/09/21 1018       Assessment   Medical Diagnosis vertigo    Referring Provider (PT) Latanya Presser  MD    Onset Date/Surgical Date 03/13/21      Functional Gait  Assessment   Gait assessed  Yes    Gait Level Surface Walks 20 ft in less than 5.5 sec, no assistive devices, good speed, no evidence for imbalance, normal gait pattern, deviates no more than 6 in outside of the 12 in walkway width.    Change in Gait Speed Able to change speed, demonstrates mild gait deviations, deviates 6-10 in outside of the 12 in walkway width, or no gait deviations, unable to achieve a major change in velocity, or uses a change in velocity, or uses an assistive device.    Gait with Horizontal Head Turns Performs head turns smoothly with slight change in gait velocity (eg, minor disruption to smooth gait path), deviates 6-10 in outside 12 in walkway width, or uses an assistive device.    Gait with Vertical Head Turns Performs task  with slight change in gait velocity (eg, minor disruption to smooth gait path), deviates 6 - 10 in outside 12 in walkway width or uses assistive device    Gait and Pivot Turn Pivot turns safely within 3 sec and stops quickly with no loss of balance.    Step Over Obstacle Is able to step over one shoe box (4.5 in total height) but must slow down and adjust steps to clear box safely. May require verbal cueing.    Gait with Narrow Base of Support Ambulates 4-7 steps.    Gait with Eyes Closed Walks 20 ft, uses assistive device, slower speed, mild gait deviations, deviates 6-10 in outside 12 in walkway width. Ambulates 20 ft in less than 9 sec but greater than 7 sec.    Ambulating Backwards Walks 20 ft, no assistive devices, good speed, no evidence for imbalance, normal gait    Steps Alternating feet, must use rail.    Total Score 21    FGA comment: 21/30                 Vestibular Assessment - 04/09/21 1018       Positional Testing   Dix-Hallpike Dix-Hallpike Right;Dix-Hallpike Left  Horizontal Canal Testing Horizontal Canal Right;Horizontal Canal Left      Dix-Hallpike Right   Dix-Hallpike Right Duration none    Dix-Hallpike Right Symptoms No nystagmus      Dix-Hallpike Left   Dix-Hallpike Left Duration 10 seconds    Dix-Hallpike Left Symptoms Upbeat, left rotatory nystagmus      Horizontal Canal Right   Horizontal Canal Right Duration none      Horizontal Canal Left   Horizontal Canal Left Duration <10 seconds ? short duration (may be seeing overflow from L posterior canal)    Horizontal Canal Left Symptoms Ageotrophic                      [facility] Adult PT Treatment/Exercise - 04/09/21 1108       Neuro Re-ed    Neuro Re-ed Details  single leg stance activities             Vestibular Treatment/Exercise - 04/09/21 1026       Vestibular Treatment/Exercise   Vestibular Treatment Provided Canalith Repositioning;Habituation;Gaze    Canalith Repositioning  Epley Manuever Left    Habituation Exercises Brandt Daroff;Horizontal Roll;Standing Horizontal Head Turns;Standing Vertical Head Turns;180 degree Turns    Gaze Exercises X1 Viewing Horizontal       EPLEY MANUEVER LEFT   Number of Reps  3    Overall Response  Symptoms Resolved     RESPONSE DETAILS LEFT on second rep no nystagmus/ At endof session, patient experienced increased dizziness when rising.  We rechecked L dix hallpike with nystagmus, so treated 3rd rep for L epley's      Canal Roll Left   Response Details  after treating L epley's no longer had nystagmus with L horizontal roll      Nestor Lewandowsky   Symptom Description  demonstrated for HEP to begin Friday if any dizziness remains      Horizontal Roll   Symptom Description  demonstrated for HEP to begin Friday if any dizziness remains      Standing Horizontal Head Turns   Number of Reps  10    Symptom Description  on foam      Standing Vertical Head Turns   Number of Reps  10    Symptom Description  on foam with CGA to min A      180 degree Turns   Number of Reps  5    Symptom Description  some instability noted      X1 Viewing Horizontal   Foot Position standing    Comments no dizziness or difficulty at faster pace                         PT Long Term Goals - 04/02/21 1302       PT LONG TERM GOAL #1   Title The patient will have negative positional testing for BPPV.    Time 4    Period Weeks    Target Date 05/02/21      PT LONG TERM GOAL #2   Title The patient will report no dizziness with daily activities.    Time 4    Period Weeks    Target Date 05/02/21      PT LONG TERM GOAL #3   Title The patient will verbalize understanding of self treatment for BPPV.    Time 4    Period Weeks    Target Date 05/02/21  Plan - 04/09/21 1052     Clinical Impression Statement The patient has had a change of canals since last session for BPPV with L posterior canalithiasis  present today.  He scores 21/30 on FGA indicating some fall risk and he has difficulty walking with head turns, narrow base and negoatiating obstacles.  PT treated with L epley's and provided HEP for multi-sensory balance training and habituation.    PT Treatment/Interventions ADLs/Self Care Home Management;Canalith Repostioning;Vestibular;Patient/family education;Neuromuscular re-education;Therapeutic exercise    PT Next Visit Plan Reassess positional testing and treat as indicated.    Consulted and Agree with Plan of Care Patient             Patient will benefit from skilled therapeutic intervention in order to improve the following deficits and impairments:     Visit Diagnosis: BPPV (benign paroxysmal positional vertigo), left  Other abnormalities of gait and mobility     Problem List Patient Active Problem List   Diagnosis Date Noted   Persistent cough 05/03/2018   Essential hypertension 05/03/2018   Dizziness and giddiness 07/08/2016   Type 2 diabetes mellitus without complication, without long-term current use of insulin ([place]) 08/01/2015   Left hand paresthesia 05/23/2015   Viral wart 03/25/2015   Benign prostatic hyperplasia with urinary frequency 01/04/2015   ERECTILE DYSFUNCTION 08/20/2014   Left wrist pain 08/20/2014   Dyslipidemia 03/02/2013   HYPERLIPIDEMIA 11/23/2006    PHYSICAL THERAPY DISCHARGE SUMMARY  Visits from Start of Care: 2  Current functional level related to goals / functional outcomes: *patient called and cancelled sessions due to improved symptoms.   Remaining deficits: See above   Education / Equipment: HEP   Patient agrees to discharge. Patient goals were partially met. Patient is being discharged due to the patient's request.   Makalya Nave, PT 04/09/2021, 11:09 AM  [facility] [address], [place], [place], [address] Phone: [phone]   Fax:  [phone]  Name: Rahmon Heigl MRN: [id number] Date of Birth: 1950/07/26

## 2021-04-16 ENCOUNTER — Ambulatory Visit: Payer: Medicare HMO | Admitting: Rehabilitative and Restorative Service Providers"

## 2021-07-31 DIAGNOSIS — R351 Nocturia: Secondary | ICD-10-CM | POA: Diagnosis not present

## 2021-07-31 DIAGNOSIS — N401 Enlarged prostate with lower urinary tract symptoms: Secondary | ICD-10-CM | POA: Diagnosis not present

## 2021-09-11 DIAGNOSIS — E1142 Type 2 diabetes mellitus with diabetic polyneuropathy: Secondary | ICD-10-CM | POA: Diagnosis not present

## 2021-09-11 DIAGNOSIS — N401 Enlarged prostate with lower urinary tract symptoms: Secondary | ICD-10-CM | POA: Diagnosis not present

## 2021-09-11 DIAGNOSIS — E1165 Type 2 diabetes mellitus with hyperglycemia: Secondary | ICD-10-CM | POA: Diagnosis not present

## 2021-09-11 DIAGNOSIS — I10 Essential (primary) hypertension: Secondary | ICD-10-CM | POA: Diagnosis not present

## 2021-09-11 DIAGNOSIS — E785 Hyperlipidemia, unspecified: Secondary | ICD-10-CM | POA: Diagnosis not present

## 2021-10-08 DIAGNOSIS — E1165 Type 2 diabetes mellitus with hyperglycemia: Secondary | ICD-10-CM | POA: Diagnosis not present

## 2021-10-08 DIAGNOSIS — Z7689 Persons encountering health services in other specified circumstances: Secondary | ICD-10-CM | POA: Diagnosis not present

## 2021-10-30 DIAGNOSIS — N401 Enlarged prostate with lower urinary tract symptoms: Secondary | ICD-10-CM | POA: Diagnosis not present

## 2021-10-30 DIAGNOSIS — R351 Nocturia: Secondary | ICD-10-CM | POA: Diagnosis not present

## 2021-12-12 DIAGNOSIS — I10 Essential (primary) hypertension: Secondary | ICD-10-CM | POA: Diagnosis not present

## 2021-12-12 DIAGNOSIS — N521 Erectile dysfunction due to diseases classified elsewhere: Secondary | ICD-10-CM | POA: Diagnosis not present

## 2021-12-12 DIAGNOSIS — Z5181 Encounter for therapeutic drug level monitoring: Secondary | ICD-10-CM | POA: Diagnosis not present

## 2021-12-12 DIAGNOSIS — E1165 Type 2 diabetes mellitus with hyperglycemia: Secondary | ICD-10-CM | POA: Diagnosis not present

## 2021-12-12 DIAGNOSIS — N401 Enlarged prostate with lower urinary tract symptoms: Secondary | ICD-10-CM | POA: Diagnosis not present

## 2021-12-31 DIAGNOSIS — H0288A Meibomian gland dysfunction right eye, upper and lower eyelids: Secondary | ICD-10-CM | POA: Diagnosis not present

## 2021-12-31 DIAGNOSIS — H25813 Combined forms of age-related cataract, bilateral: Secondary | ICD-10-CM | POA: Diagnosis not present

## 2021-12-31 DIAGNOSIS — H43822 Vitreomacular adhesion, left eye: Secondary | ICD-10-CM | POA: Diagnosis not present

## 2021-12-31 DIAGNOSIS — H40023 Open angle with borderline findings, high risk, bilateral: Secondary | ICD-10-CM | POA: Diagnosis not present

## 2022-01-07 DIAGNOSIS — E1169 Type 2 diabetes mellitus with other specified complication: Secondary | ICD-10-CM | POA: Diagnosis not present

## 2022-01-07 DIAGNOSIS — E785 Hyperlipidemia, unspecified: Secondary | ICD-10-CM | POA: Diagnosis not present

## 2022-01-07 DIAGNOSIS — E669 Obesity, unspecified: Secondary | ICD-10-CM | POA: Diagnosis not present

## 2022-01-07 DIAGNOSIS — N400 Enlarged prostate without lower urinary tract symptoms: Secondary | ICD-10-CM | POA: Diagnosis not present

## 2022-01-07 DIAGNOSIS — Z6830 Body mass index (BMI) 30.0-30.9, adult: Secondary | ICD-10-CM | POA: Diagnosis not present

## 2022-01-07 DIAGNOSIS — I700 Atherosclerosis of aorta: Secondary | ICD-10-CM | POA: Diagnosis not present

## 2022-01-07 DIAGNOSIS — Z008 Encounter for other general examination: Secondary | ICD-10-CM | POA: Diagnosis not present

## 2022-01-07 DIAGNOSIS — E1159 Type 2 diabetes mellitus with other circulatory complications: Secondary | ICD-10-CM | POA: Diagnosis not present

## 2022-01-07 DIAGNOSIS — I10 Essential (primary) hypertension: Secondary | ICD-10-CM | POA: Diagnosis not present

## 2022-01-09 DIAGNOSIS — E785 Hyperlipidemia, unspecified: Secondary | ICD-10-CM | POA: Diagnosis not present

## 2022-01-09 DIAGNOSIS — Z23 Encounter for immunization: Secondary | ICD-10-CM | POA: Diagnosis not present

## 2022-01-09 DIAGNOSIS — Z0001 Encounter for general adult medical examination with abnormal findings: Secondary | ICD-10-CM | POA: Diagnosis not present

## 2022-01-09 DIAGNOSIS — E538 Deficiency of other specified B group vitamins: Secondary | ICD-10-CM | POA: Diagnosis not present

## 2022-01-09 DIAGNOSIS — Z125 Encounter for screening for malignant neoplasm of prostate: Secondary | ICD-10-CM | POA: Diagnosis not present

## 2022-01-09 DIAGNOSIS — E1165 Type 2 diabetes mellitus with hyperglycemia: Secondary | ICD-10-CM | POA: Diagnosis not present

## 2022-01-15 DIAGNOSIS — Z1211 Encounter for screening for malignant neoplasm of colon: Secondary | ICD-10-CM | POA: Diagnosis not present

## 2022-01-15 DIAGNOSIS — Z136 Encounter for screening for cardiovascular disorders: Secondary | ICD-10-CM | POA: Diagnosis not present

## 2022-01-15 DIAGNOSIS — N401 Enlarged prostate with lower urinary tract symptoms: Secondary | ICD-10-CM | POA: Diagnosis not present

## 2022-01-15 DIAGNOSIS — I10 Essential (primary) hypertension: Secondary | ICD-10-CM | POA: Diagnosis not present

## 2022-01-15 DIAGNOSIS — E785 Hyperlipidemia, unspecified: Secondary | ICD-10-CM | POA: Diagnosis not present

## 2022-01-15 DIAGNOSIS — E663 Overweight: Secondary | ICD-10-CM | POA: Diagnosis not present

## 2022-01-15 DIAGNOSIS — E1165 Type 2 diabetes mellitus with hyperglycemia: Secondary | ICD-10-CM | POA: Diagnosis not present

## 2022-01-15 DIAGNOSIS — E1142 Type 2 diabetes mellitus with diabetic polyneuropathy: Secondary | ICD-10-CM | POA: Diagnosis not present

## 2022-01-15 DIAGNOSIS — Z125 Encounter for screening for malignant neoplasm of prostate: Secondary | ICD-10-CM | POA: Diagnosis not present

## 2022-01-15 DIAGNOSIS — Z23 Encounter for immunization: Secondary | ICD-10-CM | POA: Diagnosis not present

## 2022-01-15 DIAGNOSIS — Z0001 Encounter for general adult medical examination with abnormal findings: Secondary | ICD-10-CM | POA: Diagnosis not present

## 2022-01-16 DIAGNOSIS — E1165 Type 2 diabetes mellitus with hyperglycemia: Secondary | ICD-10-CM | POA: Diagnosis not present

## 2022-01-22 DIAGNOSIS — R351 Nocturia: Secondary | ICD-10-CM | POA: Diagnosis not present

## 2022-01-22 DIAGNOSIS — R3915 Urgency of urination: Secondary | ICD-10-CM | POA: Diagnosis not present

## 2022-01-22 DIAGNOSIS — N401 Enlarged prostate with lower urinary tract symptoms: Secondary | ICD-10-CM | POA: Diagnosis not present

## 2022-01-24 DIAGNOSIS — H5203 Hypermetropia, bilateral: Secondary | ICD-10-CM | POA: Diagnosis not present

## 2022-01-24 DIAGNOSIS — H524 Presbyopia: Secondary | ICD-10-CM | POA: Diagnosis not present

## 2022-01-24 DIAGNOSIS — H52209 Unspecified astigmatism, unspecified eye: Secondary | ICD-10-CM | POA: Diagnosis not present

## 2022-11-11 ENCOUNTER — Encounter: Payer: Medicare PPO | Attending: Internal Medicine | Admitting: Skilled Nursing Facility1

## 2022-11-23 ENCOUNTER — Encounter: Payer: Medicare PPO | Attending: Internal Medicine | Admitting: Nutrition

## 2022-11-23 DIAGNOSIS — E119 Type 2 diabetes mellitus without complications: Secondary | ICD-10-CM | POA: Insufficient documentation

## 2022-11-23 NOTE — Patient Instructions (Signed)
Change sensor every 14 days Bring reader with you at every visit. Call Josephine Igo help line if questions.

## 2022-11-23 NOTE — Progress Notes (Signed)
Connor Murray is here today to learn how to use the [place] sensors.  His phone does not support the app for this, so his reader was set with date and time.  He was shown how and where to apply the sensor and how to start this on his reader.  He was told to keep the reader with him at all times and to bring it with him when he sees the doctor.  He agreed to do this.  It was explained that this sensor will last him for 2 weeks and the he will need to peal it off in insert a new one on the other arm.  He was told to not go through an MRI, xray and cat scan wearing this sensor. He reported good understanding of this.  Alerts were set for 70 as low and 250 as high.  He had no final questions.

## 2023-02-11 ENCOUNTER — Encounter: Payer: Self-pay | Admitting: Rehabilitative and Restorative Service Providers"

## 2023-05-05 ENCOUNTER — Other Ambulatory Visit: Payer: Self-pay | Admitting: Internal Medicine

## 2023-05-05 ENCOUNTER — Ambulatory Visit
Admission: RE | Admit: 2023-05-05 | Discharge: 2023-05-05 | Disposition: A | Discharge status: Home / Self Care | Payer: Medicare HMO | Source: Ambulatory Visit | Attending: Internal Medicine | Admitting: Internal Medicine

## 2023-05-05 DIAGNOSIS — J209 Acute bronchitis, unspecified: Secondary | ICD-10-CM

## 2023-06-10 ENCOUNTER — Ambulatory Visit: Payer: Medicare HMO | Admitting: Podiatry

## 2023-06-10 ENCOUNTER — Ambulatory Visit (INDEPENDENT_AMBULATORY_CARE_PROVIDER_SITE_OTHER): Payer: Medicare HMO

## 2023-06-10 ENCOUNTER — Encounter: Payer: Self-pay | Admitting: Podiatry

## 2023-06-10 VITALS — Ht 66.0 in | Wt 185.0 lb

## 2023-06-10 DIAGNOSIS — M79672 Pain in left foot: Secondary | ICD-10-CM | POA: Diagnosis not present

## 2023-06-10 DIAGNOSIS — M21611 Bunion of right foot: Secondary | ICD-10-CM

## 2023-06-10 DIAGNOSIS — M79671 Pain in right foot: Secondary | ICD-10-CM | POA: Diagnosis not present

## 2023-06-10 DIAGNOSIS — M21612 Bunion of left foot: Secondary | ICD-10-CM

## 2023-06-10 DIAGNOSIS — M722 Plantar fascial fibromatosis: Secondary | ICD-10-CM

## 2023-06-10 NOTE — Progress Notes (Signed)
 Subjective:   Patient ID: Connor Murray, male   DOB: 73 y.o.   MRN: [id number]   HPI Patient presents stating he has developed some nodules in his arch of both feet and he was concerned and sent over by his primary care doctor.  Patient states they were tender but they are not currently and he does have flatfeet and bunions.  Patient does not smoke likes to be active   Review of Systems  All other systems reviewed and are negative.       Objective:  Physical Exam Vitals and nursing note reviewed.  Constitutional:      Appearance: He is well-developed.  Pulmonary:     Effort: Pulmonary effort is normal.  Musculoskeletal:        General: Normal range of motion.  Skin:    General: Skin is warm.  Neurological:     Mental Status: He is alert.     Neurovascular status found to be intact muscle strength found to be adequate range of motion adequate with nodules within the mid arch area bilateral measuring about 1.5 x 1 cm that are thick and within the fascia itself.  Mild discomfort associated with this     Assessment:  Possibility for fibroma possibility for calcification or other pathology     Plan:  H&P x-rays reviewed.  I did discuss at great length and we discussed treatment options including excision watching it or possible injection treatment.  Since they are nontender he would just rather watch it and as long as they do not grow in size or become painful I am okay with this.  He does understand we are not getting pathology but he is willing to go this route and will be seen as needed  X-rays indicated significant flatfoot deformity bunion deformity bilateral with digital deformity and no indication of calcification within the arch

## 2023-10-19 IMAGING — US US EXTREM LOW DUPLEX ARTERIAL BILAT
1 series · 14 of 25 positions shown · non-contrast
Comparison: None.

CLINICAL DATA: Peripheral vascular disease

EXAM:
NONINVASIVE PHYSIOLOGIC VASCULAR STUDY OF BILATERAL LOWER
EXTREMITIES
TECHNIQUE: Evaluation of both lower extremities were performed at rest,
including calculation of ankle-brachial indices with single level
Doppler, pressure and pulse volume recording.

[Series 1: us extrem low duplex arterial bilat · 0.07mm/px · 14 of 107 slices shown]
[im 1/107]
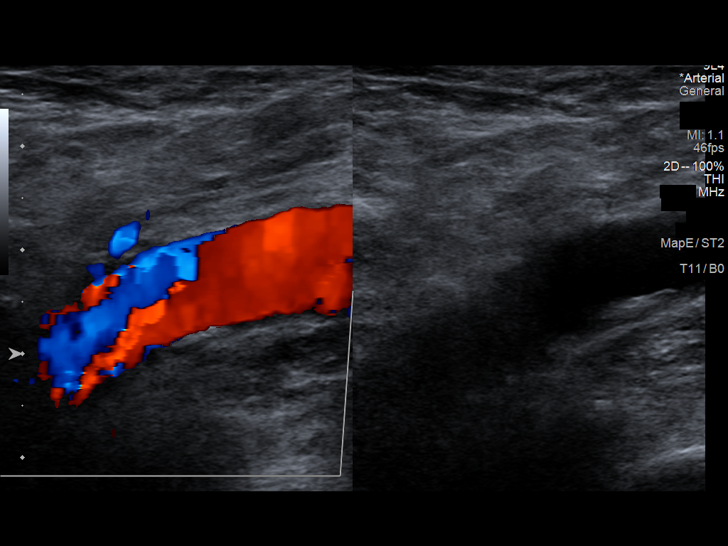
[im 9/107]
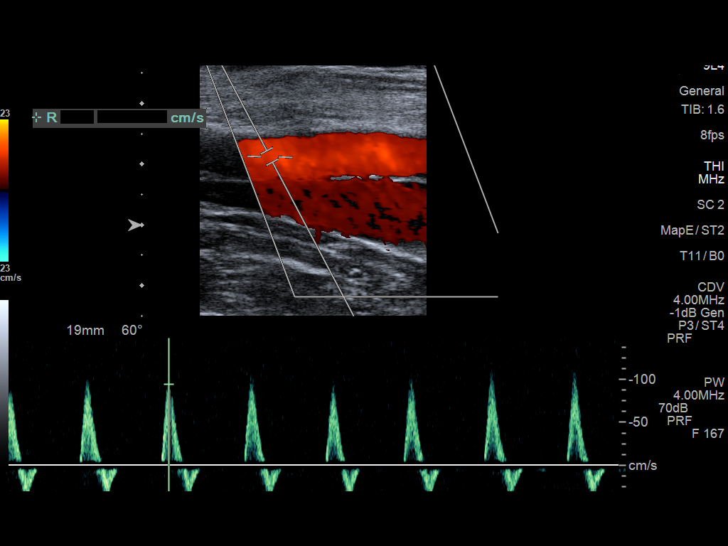
[im 18/107]
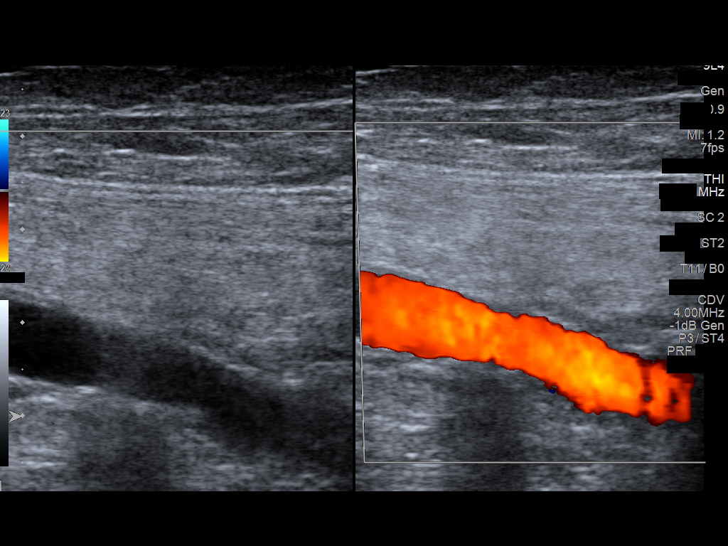
[im 27/107]
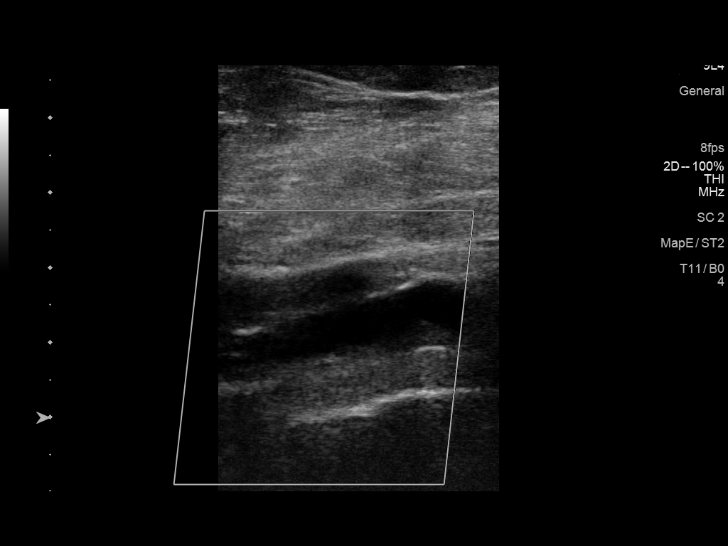
[im 36/107]
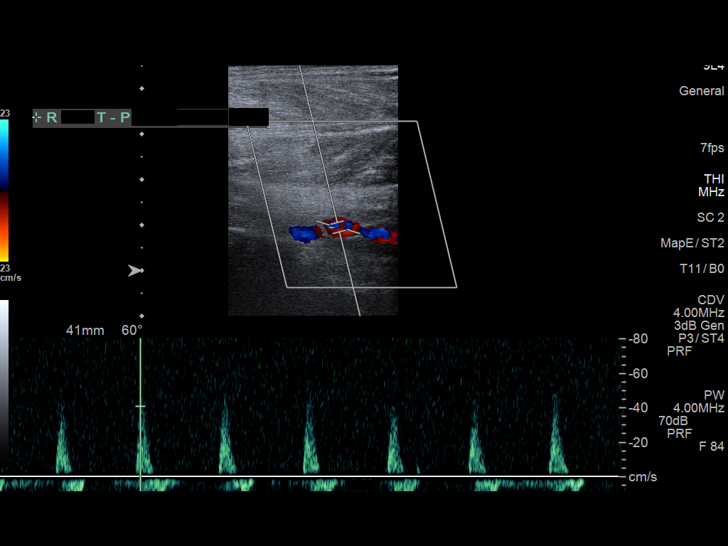
[im 40/107]
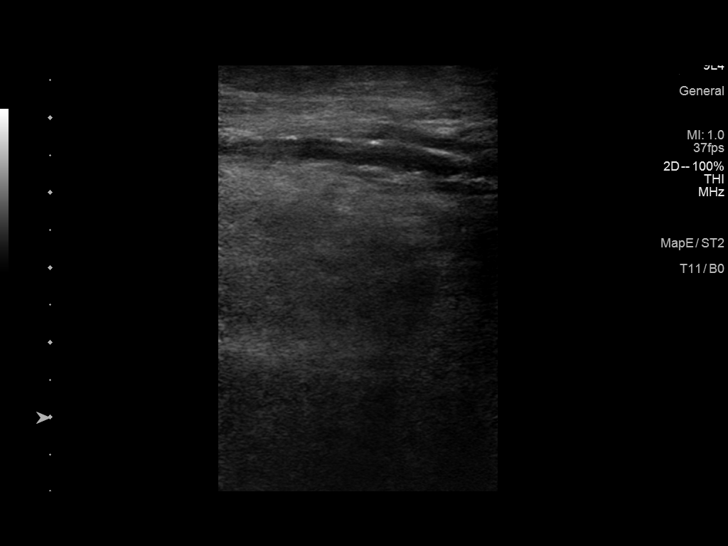
[im 49/107]
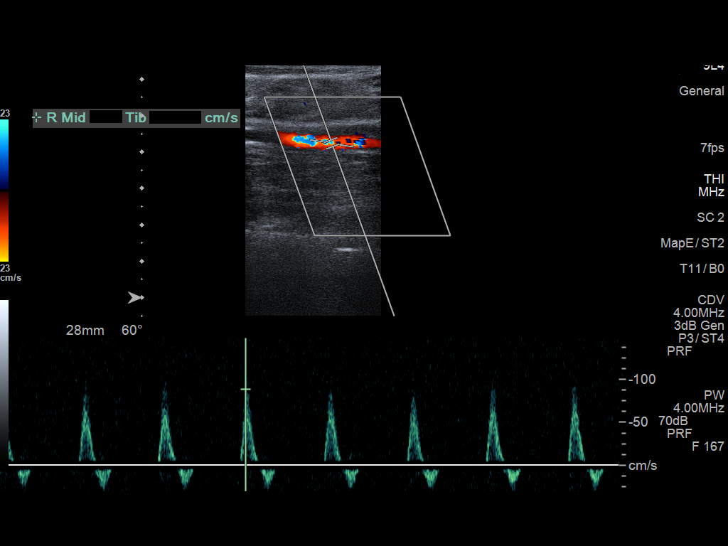
[im 58/107]
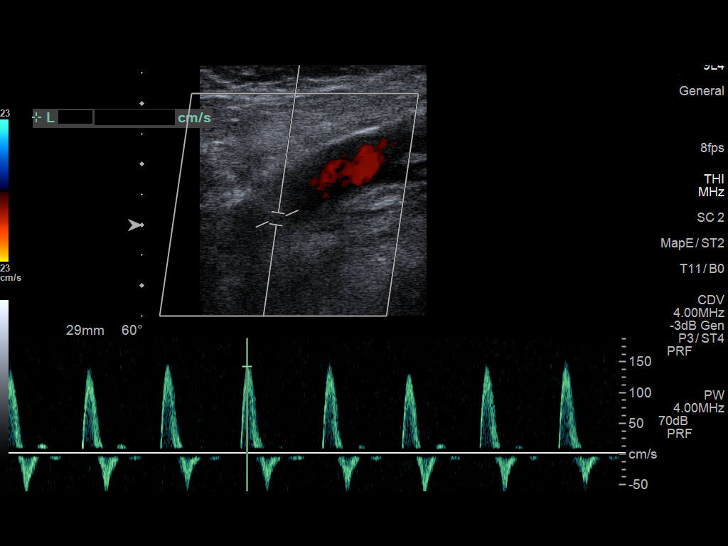
[im 67/107]
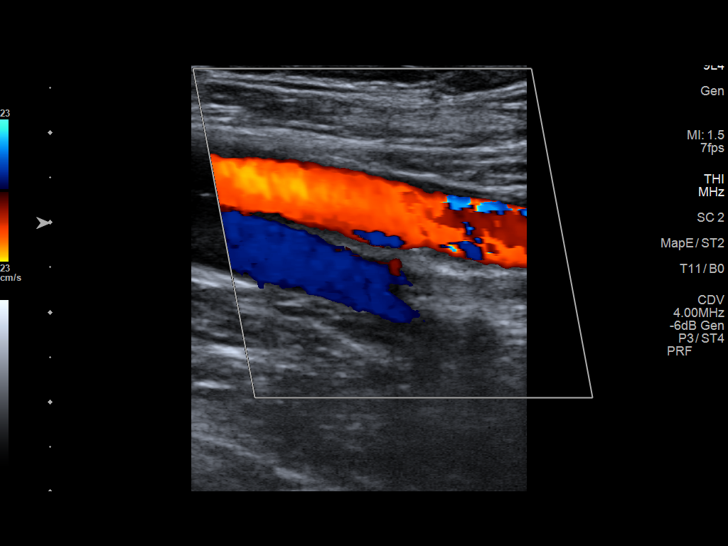
[im 71/107]
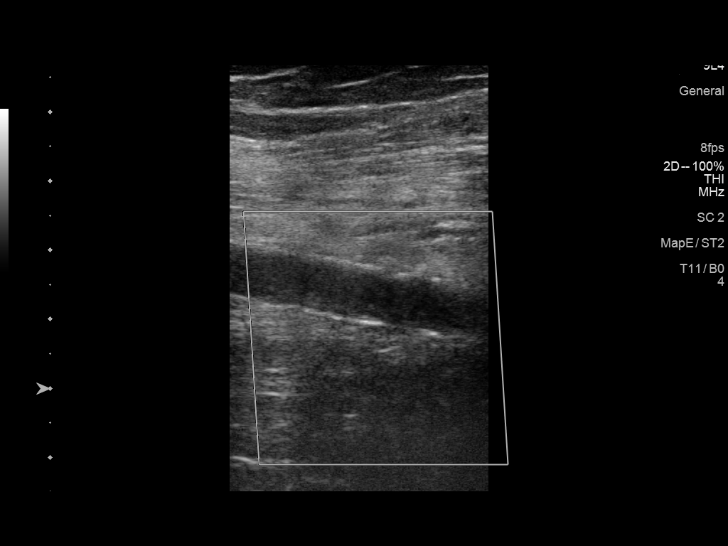
[im 80/107]
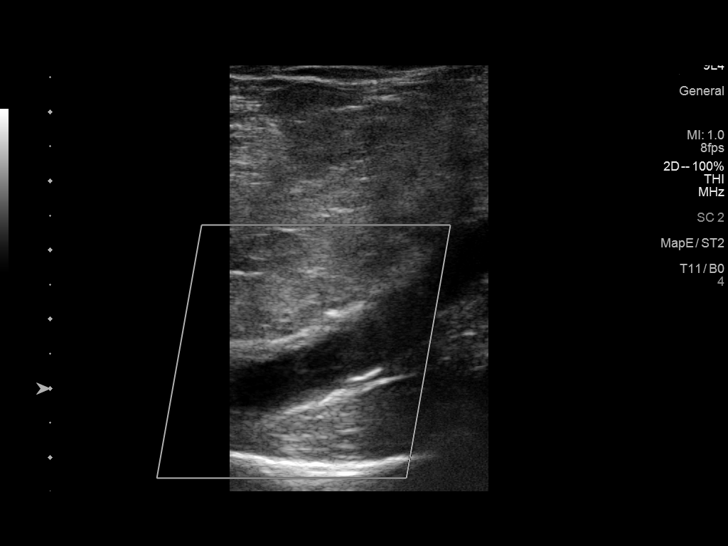
[im 89/107]
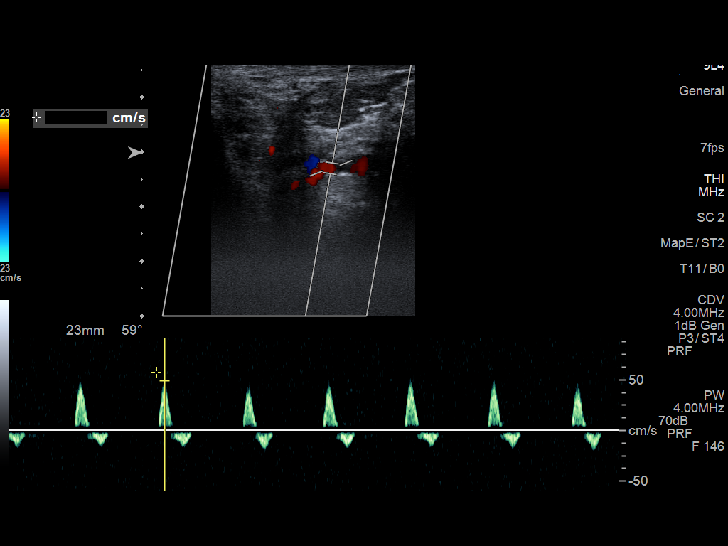
[im 98/107]
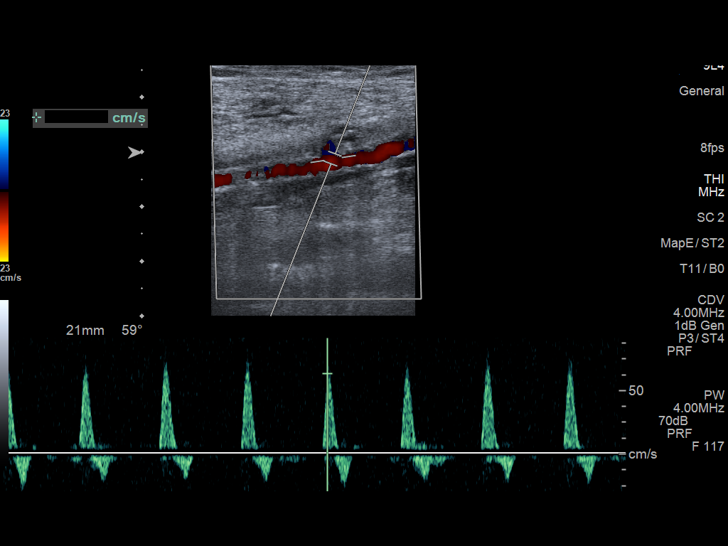
[im 107/107]
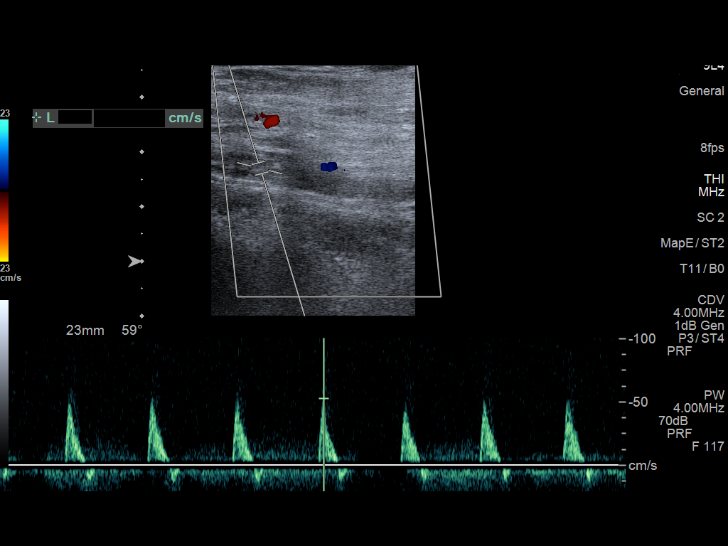

[14 of 25 positions shown; findings below may reference images not displayed]

FINDINGS: Right ABI:

Left ABI:

Right Lower Extremity: Multiphasic arterial waveforms seen
throughout the right lower extremity. No focal velocity elevation to
indicate stenosis. Minimal scattered plaque seen within the common
femoral, popliteal, and posterior tibial arteries.

Left Lower Extremity: Multiphasic arterial waveforms seen throughout
the left lower extremity. No velocity elevation to indicate focal
stenosis. Minimal scattered plaque in the superficial femoral and
popliteal arteries.

1.0-1.4 Normal
IMPRESSION: Normal ABI examination of the lower extremities

## 2023-10-25 DIAGNOSIS — E785 Hyperlipidemia, unspecified: Secondary | ICD-10-CM | POA: Diagnosis not present

## 2023-10-25 DIAGNOSIS — E559 Vitamin D deficiency, unspecified: Secondary | ICD-10-CM | POA: Diagnosis not present

## 2023-10-25 DIAGNOSIS — M545 Low back pain, unspecified: Secondary | ICD-10-CM | POA: Diagnosis not present

## 2023-10-25 DIAGNOSIS — E1165 Type 2 diabetes mellitus with hyperglycemia: Secondary | ICD-10-CM | POA: Diagnosis not present

## 2023-10-25 DIAGNOSIS — R600 Localized edema: Secondary | ICD-10-CM | POA: Diagnosis not present

## 2023-10-25 DIAGNOSIS — M2142 Flat foot [pes planus] (acquired), left foot: Secondary | ICD-10-CM | POA: Diagnosis not present

## 2023-10-25 DIAGNOSIS — I10 Essential (primary) hypertension: Secondary | ICD-10-CM | POA: Diagnosis not present

## 2023-10-25 DIAGNOSIS — M2141 Flat foot [pes planus] (acquired), right foot: Secondary | ICD-10-CM | POA: Diagnosis not present

## 2023-10-25 DIAGNOSIS — E1142 Type 2 diabetes mellitus with diabetic polyneuropathy: Secondary | ICD-10-CM | POA: Diagnosis not present

## 2023-12-28 DIAGNOSIS — Z23 Encounter for immunization: Secondary | ICD-10-CM | POA: Diagnosis not present

## 2024-01-13 DIAGNOSIS — H0288B Meibomian gland dysfunction left eye, upper and lower eyelids: Secondary | ICD-10-CM | POA: Diagnosis not present

## 2024-01-13 DIAGNOSIS — H40023 Open angle with borderline findings, high risk, bilateral: Secondary | ICD-10-CM | POA: Diagnosis not present

## 2024-01-13 DIAGNOSIS — H0288A Meibomian gland dysfunction right eye, upper and lower eyelids: Secondary | ICD-10-CM | POA: Diagnosis not present

## 2024-04-29 ENCOUNTER — Other Ambulatory Visit (HOSPITAL_BASED_OUTPATIENT_CLINIC_OR_DEPARTMENT_OTHER): Payer: Self-pay | Admitting: Internal Medicine

## 2024-04-29 DIAGNOSIS — B9689 Other specified bacterial agents as the cause of diseases classified elsewhere: Secondary | ICD-10-CM

## 2024-04-29 DIAGNOSIS — J208 Acute bronchitis due to other specified organisms: Secondary | ICD-10-CM

## 2024-05-02 ENCOUNTER — Other Ambulatory Visit: Payer: Self-pay | Admitting: Internal Medicine

## 2024-05-02 DIAGNOSIS — I739 Peripheral vascular disease, unspecified: Secondary | ICD-10-CM

## 2024-05-02 DIAGNOSIS — I77811 Abdominal aortic ectasia: Secondary | ICD-10-CM

## 2024-05-16 ENCOUNTER — Other Ambulatory Visit

## 2024-05-29 ENCOUNTER — Other Ambulatory Visit
